# Patient Record
Sex: Female | Born: 1985 | Race: White | Hispanic: Yes | Marital: Married | Smoking: Never smoker
Health system: Southern US, Community
[De-identification: ages and names within clinical notes are randomized; demographics above are authoritative.]

## PROBLEM LIST (undated history)

## (undated) DIAGNOSIS — O009 Unspecified ectopic pregnancy without intrauterine pregnancy: Secondary | ICD-10-CM

## (undated) DIAGNOSIS — N915 Oligomenorrhea, unspecified: Secondary | ICD-10-CM

## (undated) DIAGNOSIS — Z789 Other specified health status: Secondary | ICD-10-CM

## (undated) HISTORY — DX: Oligomenorrhea, unspecified: N91.5

## (undated) HISTORY — DX: Other specified health status: Z78.9

## (undated) HISTORY — PX: NO PAST SURGERIES: SHX2092

## (undated) HISTORY — DX: Unspecified ectopic pregnancy without intrauterine pregnancy: O00.90

---

## 2009-09-15 ENCOUNTER — Ambulatory Visit: Payer: Self-pay | Admitting: Gynecology

## 2009-09-29 ENCOUNTER — Ambulatory Visit: Payer: Self-pay | Admitting: Gynecology

## 2009-10-13 ENCOUNTER — Ambulatory Visit: Payer: Self-pay | Admitting: Gynecology

## 2009-11-10 ENCOUNTER — Ambulatory Visit: Payer: Self-pay | Admitting: Gynecology

## 2009-11-17 ENCOUNTER — Ambulatory Visit: Payer: Self-pay | Admitting: Gynecology

## 2009-12-30 ENCOUNTER — Inpatient Hospital Stay (HOSPITAL_COMMUNITY): Admission: AD | Admit: 2009-12-30 | Discharge: 2009-12-30 | Payer: Self-pay | Admitting: Obstetrics & Gynecology

## 2009-12-30 ENCOUNTER — Ambulatory Visit: Payer: Self-pay | Admitting: Nurse Practitioner

## 2009-12-31 ENCOUNTER — Ambulatory Visit: Payer: Self-pay | Admitting: Gynecology

## 2010-01-01 ENCOUNTER — Inpatient Hospital Stay (HOSPITAL_COMMUNITY): Admission: AD | Admit: 2010-01-01 | Discharge: 2010-01-01 | Payer: Self-pay | Admitting: Gynecology

## 2010-01-01 ENCOUNTER — Ambulatory Visit: Payer: Self-pay | Admitting: Gynecology

## 2010-01-01 LAB — US OB TRANSVAGINAL

## 2010-01-05 ENCOUNTER — Ambulatory Visit: Payer: Self-pay | Admitting: Gynecology

## 2010-01-08 ENCOUNTER — Ambulatory Visit: Payer: Self-pay | Admitting: Women's Health

## 2010-01-14 ENCOUNTER — Ambulatory Visit: Payer: Self-pay | Admitting: Gynecology

## 2010-02-09 ENCOUNTER — Ambulatory Visit: Payer: Self-pay | Admitting: Gynecology

## 2010-05-04 ENCOUNTER — Ambulatory Visit: Payer: Self-pay | Admitting: Gynecology

## 2010-08-24 ENCOUNTER — Ambulatory Visit (INDEPENDENT_AMBULATORY_CARE_PROVIDER_SITE_OTHER): Payer: Self-pay | Admitting: Gynecology

## 2010-08-24 ENCOUNTER — Other Ambulatory Visit: Payer: Self-pay

## 2010-08-24 DIAGNOSIS — O9989 Other specified diseases and conditions complicating pregnancy, childbirth and the puerperium: Secondary | ICD-10-CM

## 2010-08-24 DIAGNOSIS — N912 Amenorrhea, unspecified: Secondary | ICD-10-CM

## 2010-08-24 LAB — US OB TRANSVAGINAL

## 2010-08-31 ENCOUNTER — Inpatient Hospital Stay (HOSPITAL_COMMUNITY)
Admission: AD | Admit: 2010-08-31 | Discharge: 2010-08-31 | Disposition: A | Discharge status: Home / Self Care | Payer: Self-pay | Source: Ambulatory Visit | Attending: Gynecology | Admitting: Gynecology

## 2010-08-31 ENCOUNTER — Ambulatory Visit (INDEPENDENT_AMBULATORY_CARE_PROVIDER_SITE_OTHER): Payer: Self-pay | Admitting: Gynecology

## 2010-08-31 ENCOUNTER — Other Ambulatory Visit: Payer: Self-pay

## 2010-08-31 ENCOUNTER — Other Ambulatory Visit (INDEPENDENT_AMBULATORY_CARE_PROVIDER_SITE_OTHER): Payer: Self-pay

## 2010-08-31 DIAGNOSIS — N912 Amenorrhea, unspecified: Secondary | ICD-10-CM

## 2010-08-31 DIAGNOSIS — O00109 Unspecified tubal pregnancy without intrauterine pregnancy: Secondary | ICD-10-CM

## 2010-08-31 LAB — US OB TRANSVAGINAL

## 2010-08-31 LAB — DIFFERENTIAL
Basophils Absolute: 0 10*3/uL (ref 0.0–0.1)
Basophils Relative: 0 % (ref 0–1)
Eosinophils Absolute: 0 10*3/uL (ref 0.0–0.7)
Lymphocytes Relative: 21 % (ref 12–46)
Lymphs Abs: 1.8 10*3/uL (ref 0.7–4.0)
Monocytes Absolute: 0.7 10*3/uL (ref 0.1–1.0)
Monocytes Relative: 9 % (ref 3–12)
Neutro Abs: 6 10*3/uL (ref 1.7–7.7)
Neutrophils Relative %: 70 % (ref 43–77)

## 2010-08-31 LAB — CBC
MCH: 30 pg (ref 26.0–34.0)
MCHC: 34.2 g/dL (ref 30.0–36.0)
MCV: 87.7 fL (ref 78.0–100.0)
Platelets: 214 10*3/uL (ref 150–400)
RBC: 4.3 MIL/uL (ref 3.87–5.11)
RDW: 13.4 % (ref 11.5–15.5)
WBC: 8.5 10*3/uL (ref 4.0–10.5)

## 2010-08-31 LAB — CREATININE, SERUM
Creatinine, Ser: 0.69 mg/dL (ref 0.4–1.2)
GFR calc non Af Amer: >60 mL/min (ref >60)

## 2010-08-31 LAB — AST: AST: 21 U/L (ref 0–37)

## 2010-09-03 ENCOUNTER — Other Ambulatory Visit (INDEPENDENT_AMBULATORY_CARE_PROVIDER_SITE_OTHER): Payer: Self-pay

## 2010-09-03 DIAGNOSIS — O00119 Unspecified tubal pregnancy with intrauterine pregnancy: Secondary | ICD-10-CM

## 2010-09-06 ENCOUNTER — Other Ambulatory Visit (INDEPENDENT_AMBULATORY_CARE_PROVIDER_SITE_OTHER): Payer: Self-pay

## 2010-09-06 DIAGNOSIS — N912 Amenorrhea, unspecified: Secondary | ICD-10-CM

## 2010-09-13 ENCOUNTER — Other Ambulatory Visit (INDEPENDENT_AMBULATORY_CARE_PROVIDER_SITE_OTHER): Payer: Self-pay

## 2010-09-13 DIAGNOSIS — O00109 Unspecified tubal pregnancy without intrauterine pregnancy: Secondary | ICD-10-CM

## 2010-09-20 ENCOUNTER — Other Ambulatory Visit (INDEPENDENT_AMBULATORY_CARE_PROVIDER_SITE_OTHER): Payer: Self-pay

## 2010-09-20 DIAGNOSIS — O00109 Unspecified tubal pregnancy without intrauterine pregnancy: Secondary | ICD-10-CM

## 2010-09-20 LAB — URINE MICROSCOPIC-ADD ON

## 2010-09-20 LAB — POCT PREGNANCY, URINE: Preg Test, Ur: POSITIVE

## 2010-09-20 LAB — URINALYSIS, ROUTINE W REFLEX MICROSCOPIC
Bilirubin Urine: NEGATIVE
Glucose, UA: NEGATIVE mg/dL
pH: 7 (ref 5.0–8.0)

## 2010-09-20 LAB — URINE CULTURE: Colony Count: 55000

## 2010-09-20 LAB — CBC
Hemoglobin: 12.5 g/dL (ref 12.0–15.0)
MCH: 31 pg (ref 26.0–34.0)

## 2010-09-20 LAB — HCG, QUANTITATIVE, PREGNANCY: hCG, Beta Chain, Quant, S: 326 m[IU]/mL — ABNORMAL HIGH (ref <5)

## 2010-09-20 LAB — WET PREP, GENITAL: Yeast Wet Prep HPF POC: NONE SEEN

## 2010-09-27 ENCOUNTER — Other Ambulatory Visit (INDEPENDENT_AMBULATORY_CARE_PROVIDER_SITE_OTHER): Payer: Self-pay

## 2010-09-27 DIAGNOSIS — O00109 Unspecified tubal pregnancy without intrauterine pregnancy: Secondary | ICD-10-CM

## 2010-09-30 ENCOUNTER — Other Ambulatory Visit: Payer: Self-pay

## 2010-10-04 ENCOUNTER — Other Ambulatory Visit (INDEPENDENT_AMBULATORY_CARE_PROVIDER_SITE_OTHER): Payer: Self-pay

## 2010-10-04 DIAGNOSIS — O00109 Unspecified tubal pregnancy without intrauterine pregnancy: Secondary | ICD-10-CM

## 2010-10-11 ENCOUNTER — Other Ambulatory Visit (INDEPENDENT_AMBULATORY_CARE_PROVIDER_SITE_OTHER): Payer: Self-pay

## 2010-10-11 DIAGNOSIS — O00109 Unspecified tubal pregnancy without intrauterine pregnancy: Secondary | ICD-10-CM

## 2010-10-12 ENCOUNTER — Other Ambulatory Visit: Payer: Self-pay

## 2010-10-14 ENCOUNTER — Other Ambulatory Visit: Payer: Self-pay

## 2010-10-29 ENCOUNTER — Other Ambulatory Visit: Payer: Self-pay

## 2012-03-28 ENCOUNTER — Ambulatory Visit (INDEPENDENT_AMBULATORY_CARE_PROVIDER_SITE_OTHER): Payer: Self-pay | Admitting: Gynecology

## 2012-03-28 ENCOUNTER — Encounter: Payer: Self-pay | Admitting: Gynecology

## 2012-03-28 VITALS — BP 116/70

## 2012-03-28 DIAGNOSIS — Z8742 Personal history of other diseases of the female genital tract: Secondary | ICD-10-CM

## 2012-03-28 DIAGNOSIS — N898 Other specified noninflammatory disorders of vagina: Secondary | ICD-10-CM

## 2012-03-28 DIAGNOSIS — Z8759 Personal history of other complications of pregnancy, childbirth and the puerperium: Secondary | ICD-10-CM

## 2012-03-28 DIAGNOSIS — N760 Acute vaginitis: Secondary | ICD-10-CM

## 2012-03-28 DIAGNOSIS — A499 Bacterial infection, unspecified: Secondary | ICD-10-CM

## 2012-03-28 LAB — WET PREP FOR TRICH, YEAST, CLUE: Yeast Wet Prep HPF POC: NONE SEEN

## 2012-03-28 MED ORDER — DOXYCYCLINE HYCLATE 100 MG PO CAPS: ORAL_CAPSULE | ORAL | Status: DC

## 2012-03-28 MED ORDER — METRONIDAZOLE 0.75 % VA GEL: 1.0000 | Freq: Two times a day (BID) | VAGINAL | Status: DC

## 2012-03-28 NOTE — Progress Notes (Signed)
Patient is a 26 year old gravida 2 para 0 AB 2 (right ectopic pregnancy treated with methotrexate 2011, left ectopic pregnancy treated with methotrexate 2012) presented to the office today complaining of vaginal discharge. She brought her Pap smear results that was [facility] which was normal and was found that she had yeast and was treated for moniliasis. She has informed me that she douches with vinegar and water daily. She also states that she does need a lot of dairy products as well as sweets. She has no family history of diabetes. Her menstrual cycles are regular. Husband using condoms for contraception.  Exam: Bartholin urethra Skene was within normal limits Vagina: No lesions or discharge Cervix: No lesions or discharge Bimanual exam: Not done Rectal exam: Not done  Wet prep few clue cells moderate WBC too numerous to count bacteria  Assessment/plan: Bacterial vaginosis will be treated with Cleocin vaginal cream each bedtime for 5 days. Patient was instructed to discontinue the daily douching with vinegar and water because her Zollie Beckers vaginal pH and predispose her to recurrent BV or yeast infections. I've given her information on refresh a probiotic gel that she continues after intercourse and after men. She would like to try to get pregnant once again. Review of her record indicated that she was treated with methotrexate for ectopic pregnancies on right and left fallopian tube 2 years apart. I explained her the before she attempts to conceive that I would like for her to have an HSG to look at the status of her fallopian tubes. She will call the office at the start of her next menstrual cycle. I've called in a prescription for Vibramycin to take 100 mg tablet twice a day for 3 days starting with the day before the procedure. I would then like to see her in consultation the week after the procedure to plan a course of management. I've instructed to go ahead and start  taking her prenatal vitamins daily. All the above instructions were provided in Spanish and we'll follow accordingly.

## 2012-03-28 NOTE — Patient Instructions (Addendum)
Bacterial Vaginosis Bacterial vaginosis (BV) is a vaginal infection where the normal balance of bacteria in the vagina is disrupted. The normal balance is then replaced by an overgrowth of certain bacteria. There are several different kinds of bacteria that can cause BV. BV is the most common vaginal infection in women of childbearing age. CAUSES   The cause of BV is not fully understood. BV develops when there is an increase or imbalance of harmful bacteria.   Some activities or behaviors can upset the normal balance of bacteria in the vagina and put women at increased risk including:   Having a new sex partner or multiple sex partners.   Douching.   Using an intrauterine device (IUD) for contraception.   It is not clear what role sexual activity plays in the development of BV. However, women that have never had sexual intercourse are rarely infected with BV.  Women do not get BV from toilet seats, bedding, swimming pools or from touching objects around them.  SYMPTOMS   Grey vaginal discharge.   A fish-like odor with discharge, especially after sexual intercourse.   Itching or burning of the vagina and vulva.   Burning or pain with urination.   Some women have no signs or symptoms at all.  DIAGNOSIS  Your caregiver must examine the vagina for signs of BV. Your caregiver will perform lab tests and look at the sample of vaginal fluid through a microscope. They will look for bacteria and abnormal cells (clue cells), a pH test higher than 4.5, and a positive amine test all associated with BV.  RISKS AND COMPLICATIONS   Pelvic inflammatory disease (PID).   Infections following gynecology surgery.   Developing HIV.   Developing herpes virus.  TREATMENT  Sometimes BV will clear up without treatment. However, all women with symptoms of BV should be treated to avoid complications, especially if gynecology surgery is planned. Female partners generally do not need to be treated. However,  BV may spread between female sex partners so treatment is helpful in preventing a recurrence of BV.   BV may be treated with antibiotics. The antibiotics come in either pill or vaginal cream forms. Either can be used with nonpregnant or pregnant women, but the recommended dosages differ. These antibiotics are not harmful to the baby.   BV can recur after treatment. If this happens, a second round of antibiotics will often be prescribed.   Treatment is important for pregnant women. If not treated, BV can cause a premature delivery, especially for a pregnant woman who had a premature birth in the past. All pregnant women who have symptoms of BV should be checked and treated.   For chronic reoccurrence of BV, treatment with a type of prescribed gel vaginally twice a week is helpful.  HOME CARE INSTRUCTIONS   Finish all medication as directed by your caregiver.   Do not have sex until treatment is completed.   Tell your sexual partner that you have a vaginal infection. They should see their caregiver and be treated if they have problems, such as a mild rash or itching.   Practice safe sex. Use condoms. Only have 1 sex partner.  PREVENTION  Basic prevention steps can help reduce the risk of upsetting the natural balance of bacteria in the vagina and developing BV:  Do not have sexual intercourse (be abstinent).   Do not douche.   Use all of the medicine prescribed for treatment of BV, even if the signs and symptoms go away.     Tell your sex partner if you have BV. That way, they can be treated, if needed, to prevent reoccurrence.  SEEK MEDICAL CARE IF:   Your symptoms are not improving after 3 days of treatment.   You have increased discharge, pain, or fever.  MAKE SURE YOU:   Understand these instructions.   Will watch your condition.   Will get help right away if you are not doing well or get worse.  FOR MORE INFORMATION  Division of STD Prevention (DSTDP), Centers for Disease  Control and Prevention: SolutionApps.co.za American Social Health Association (ASHA): www.ashastd.org  Document Released: 06/20/2005 Document Revised: 06/09/2011 Document Reviewed: 12/11/2008 [facility] Patient Information 2012 [place], [place].  Histerosalpingografa (Hysterosalpingography)  La histerosalpingografa es una radiografa del tero y de las trompas de [place]. Se realiza para diagnosticar:  Problemas para quedar embarazada.   Prdidas continuas del embarazo(aborto espontneo).  ANTES DE LA PRUEBA  Programe el estudio entre el 5 y el 10 da del ltimo perodo. El da 1 es Film/video editor del perodo.   Consulte a su mdico si debe modificar o suspender los medicamentos.   Informe al mdico si est embarazada o si sufre una infeccin o una enfermedad de transmisin sexual.   Informe si sufre alergias al yodo o a las sustancias de [place] utilizadas para Optometrist.   Coma y beba normalmente.  PRUEBA  Deber recostarse en una camilla con los pies en la piernera.   Le colocarn un tubo en el canal cervical.   Inyectarn una sustancia de contraste dentro del tero a travs del tubo.   A medida que toman las radiografas, la harn [employer].   Despus de la prueba retirarn el tubo.  DESPUES DEL ESTUDIO  Eliminar la mayor parte del lquido de [place] natural.   Podr sentir clicos y Winferd Humphrey pequea prdida. Las [employer] deben desaparecer dentro de las 24 horas.   Tome slo la medicacin que le indic el mdico.   Consulte con su mdico la fecha en que los resultados estarn disponibles. Asegrese de [employer].  Document Released: 10/05/2010 Document Revised: 06/09/2011 [facility] Patient Information 2012 [place], [place].   LLamar a Product manager oficina de Dr. Earlean Shawl) cuando empieze su periodo para que le haga la cita en el hospital [facility] para el estudio HSG. Tambien dirle que le haga una cita con Dr. [place] despues del estudio para consulta. Empieze el antibiotico el dia antes del procedimiento (dos veces al dia). Puede empezar hoy a tomar la vitamina prenatal una diaria.

## 2012-04-13 ENCOUNTER — Other Ambulatory Visit: Payer: Self-pay | Admitting: Gynecology

## 2012-04-13 ENCOUNTER — Other Ambulatory Visit: Payer: Self-pay | Admitting: *Deleted

## 2012-04-13 ENCOUNTER — Telehealth: Payer: Self-pay | Admitting: Gynecology

## 2012-04-13 DIAGNOSIS — N979 Female infertility, unspecified: Secondary | ICD-10-CM

## 2012-04-13 DIAGNOSIS — N971 Female infertility of tubal origin: Secondary | ICD-10-CM

## 2012-04-13 NOTE — Telephone Encounter (Signed)
PATIENT CALL NEEDING HSG SCHEDULED, HER LMP IS 04-11-12. I SPOKE WITH PAULA @ [facility], HSG SCHEDULED FOR 04-18-12 @ 8:30 AM PT TO CHK IN @ 8:00 AND TO PAY $742.50 UPFRONT. JENNIFER TO SEND ORDER TO [facility]. PATIENT TO CALL ME BACK IF SHE NEEDS RX. SPOKE WITH HUSBAND  AND GAVE HIM ALL THE INFROMATION. ALSO F/U APPT WITH DR Lily Peer WAS MADE FOR 04-25-12. CS

## 2012-04-18 ENCOUNTER — Ambulatory Visit (HOSPITAL_COMMUNITY)
Admission: RE | Admit: 2012-04-18 | Discharge: 2012-04-18 | Disposition: A | Discharge status: Home / Self Care | Payer: Self-pay | Source: Ambulatory Visit | Attending: Gynecology | Admitting: Gynecology

## 2012-04-18 ENCOUNTER — Ambulatory Visit: Payer: Self-pay | Admitting: Gynecology

## 2012-04-18 DIAGNOSIS — N979 Female infertility, unspecified: Secondary | ICD-10-CM | POA: Insufficient documentation

## 2012-04-18 DIAGNOSIS — N971 Female infertility of tubal origin: Secondary | ICD-10-CM

## 2012-04-18 MED ORDER — IOHEXOL 300 MG/ML  SOLN: 9.0000 mL | Freq: Once | INTRAMUSCULAR | Status: AC | PRN

## 2012-04-25 ENCOUNTER — Encounter: Payer: Self-pay | Admitting: Gynecology

## 2012-04-25 ENCOUNTER — Ambulatory Visit (INDEPENDENT_AMBULATORY_CARE_PROVIDER_SITE_OTHER): Payer: Self-pay | Admitting: Gynecology

## 2012-04-25 VITALS — BP 110/72

## 2012-04-25 DIAGNOSIS — Z8759 Personal history of other complications of pregnancy, childbirth and the puerperium: Secondary | ICD-10-CM

## 2012-04-25 DIAGNOSIS — Z8742 Personal history of other diseases of the female genital tract: Secondary | ICD-10-CM

## 2012-04-25 MED ORDER — CLOMIPHENE CITRATE 50 MG PO TABS: ORAL_TABLET | ORAL | Status: DC

## 2012-04-25 NOTE — Patient Instructions (Addendum)
Plan: Eesperar que le comienze el periodo. El primer dia del periodo es el primer dia de tu ciclo. Cuentas los dia. Del dia 5-9 del ciclo empieze a tomar dos tabletas de clomid diario.  Del dia 12-16 chequiar la orina con el detector de ovulaccion por la manana y por la noche. Cuando notes el cambio tenga relaccion con su pareja esa noche y proximas dos noches  Dia 20 o 21 0 22 venir a la oficina a sacar muestra de sangre (nivel de progesterona). No tiene que hacer cita ni tienemque estar en ayuna. Cuando vengas a sacar sangre le dices a la recepcionista que necesitas una cita con Dr. Renny Gunnarson en una semana despues de sacar sangre. 

## 2012-04-25 NOTE — Progress Notes (Signed)
Patient is a 26 year old who was seen in the office on September 25 for her annual exam. She is a gravida 2 para 0 AB 2 (right ectopic pregnancy treated with methotrexate 2011, left ectopic pregnancy treated with methotrexate 2012) she stated she had a normal Pap smear at the [facility] department January this year. Patient states that her cycles are every 25-32 days. She is taking her prenatal vitamins and now would like to attempt to get pregnant again. We had scheduled a hysterosalpingogram to make sure that she had tubal patency. An HSG report as follows:  Contrast filling of both fallopian tubes is seen, and both tubes  are normal in appearance. Intraperitoneal spill of contrast from  both fallopian tubes is demonstrated.  IMPRESSION:  Normal study. Fallopian tubes are patent bilaterally.  Assessment/plan: Patient with past history of ectopic pregnancy on each fallopian tube treated medically with methotrexate. Patient wishing to attempt to get pregnant again. Recent HSG confirming bilateral tubal patency. Patient with irregular cycles although has not fully skipped a full month. We had discussed the ovulation induction medication the risks benefits and pros and con to include the risk of hyperstimulation or multiple gestation.. She'll be started on clomiphene citrate 100 mg one by mouth daily from day 5 through 9. She will use the ovulation predictor kit from day 12 through 16 to time or intercourse. She will return back to the office on day 20 or 21 or 20 240 progesterone level and then to see me in consultation one week after her blood was drawn. She will continue her prenatal vitamins. All the above instructions were provided in Spanish.

## 2012-04-25 NOTE — Progress Notes (Deleted)
Plan: Eesperar que le comienze el periodo. El primer dia del periodo es el primer dia de tu ciclo. Cuentas los dia. Del dia 5-9 del ciclo empieze a tomar dos tabletas de clomid diario.  Del dia 12-16 chequiar la orina con Armed forces operational officer de ovulaccion por la manana y por la noche. Cuando notes el cambio tenga relaccion con su pareja esa noche y proximas dos noches  Dia 20 o 21 0 22 venir a la oficina a Environmental consultant de sangre (nivel de progesterona). No tiene que hacer cita ni tienemque estar en ayuna. Cuando vengas a Education officer, museum a la recepcionista que necesitas una cita con Dr. Lily Peer en una semana despues de sacar sangre.

## 2012-06-15 ENCOUNTER — Telehealth: Payer: Self-pay | Admitting: Anesthesiology

## 2012-06-15 MED ORDER — MEDROXYPROGESTERONE ACETATE 10 MG PO TABS: 10.0000 mg | ORAL_TABLET | Freq: Every day | ORAL | Status: DC

## 2012-06-15 NOTE — Telephone Encounter (Signed)
Dr. Lily Peer patient called requesting a rx for provera, she has not had a period in two months... On her last visit in October you gave her a rx for clomid but has not been able to start on it because of her amenorrhea...Marland KitchenMarland Kitchen  Please advise

## 2012-06-15 NOTE — Telephone Encounter (Signed)
CALLED PATIENT AND INFORMED OF MESSAGE BELOW.. ALL WAS EXPLAINED IN SPANISH.Marland Kitchen PROVERA WAS SENT TO PHARMACY... ADVISE TO CALL BACK WITH ANY ADDITIONAL QUESTIONS.Marland KitchenMarland Kitchen

## 2012-06-15 NOTE — Telephone Encounter (Signed)
Please inform patient to do a home pregnancy test. If her home pregnancy test is negative please call in a prescription for Provera 10 mg to take 1 tablet daily for 5-10 days to start her menses. When she starts her menses she can stop the Provera. She can start the Clomid from day 5 through 9 of her cycle and follow the instructions that I described on my last encounter note. If her pregnancy test is negative and if she takes the Provera for 10 days and one week after her last tablet she still has not had a menses she will need to return to the office for further evaluation.

## 2012-12-12 ENCOUNTER — Ambulatory Visit (INDEPENDENT_AMBULATORY_CARE_PROVIDER_SITE_OTHER): Payer: Self-pay | Admitting: Gynecology

## 2012-12-12 ENCOUNTER — Encounter: Payer: Self-pay | Admitting: Gynecology

## 2012-12-12 VITALS — BP 110/70 | Ht 62.25 in | Wt 124.0 lb

## 2012-12-12 DIAGNOSIS — N915 Oligomenorrhea, unspecified: Secondary | ICD-10-CM | POA: Insufficient documentation

## 2012-12-12 DIAGNOSIS — N898 Other specified noninflammatory disorders of vagina: Secondary | ICD-10-CM

## 2012-12-12 DIAGNOSIS — R300 Dysuria: Secondary | ICD-10-CM

## 2012-12-12 DIAGNOSIS — Z01419 Encounter for gynecological examination (general) (routine) without abnormal findings: Secondary | ICD-10-CM

## 2012-12-12 LAB — CBC WITH DIFFERENTIAL/PLATELET
Basophils Absolute: 0.1 10*3/uL (ref 0.0–0.1)
Basophils Relative: 1 % (ref 0–1)
Eosinophils Absolute: 0.1 10*3/uL (ref 0.0–0.7)
Eosinophils Relative: 1 % (ref 0–5)
HCT: 40.3 % (ref 36.0–46.0)
MCH: 29.2 pg (ref 26.0–34.0)
MCHC: 33 g/dL (ref 30.0–36.0)
MCV: 88.6 fL (ref 78.0–100.0)
Monocytes Absolute: 0.4 10*3/uL (ref 0.1–1.0)
RDW: 13.8 % (ref 11.5–15.5)

## 2012-12-12 LAB — URINALYSIS W MICROSCOPIC + REFLEX CULTURE
Bilirubin Urine: NEGATIVE
Specific Gravity, Urine: 1.02 (ref 1.005–1.030)
pH: 6 (ref 5.0–8.0)

## 2012-12-12 LAB — WET PREP FOR TRICH, YEAST, CLUE
Trich, Wet Prep: NONE SEEN
WBC, Wet Prep HPF POC: NONE SEEN

## 2012-12-12 MED ORDER — MEDROXYPROGESTERONE ACETATE 10 MG PO TABS: 10.0000 mg | ORAL_TABLET | Freq: Every day | ORAL | Status: DC

## 2012-12-12 MED ORDER — CLOMIPHENE CITRATE 50 MG PO TABS: ORAL_TABLET | ORAL | Status: DC

## 2012-12-12 NOTE — Progress Notes (Signed)
Ashley Holden December 21, 1985 [id number]   History:    27 y.o.  for annual gyn exam with several complaints today for. The patient stated she was having some burning in urination and slight vaginal discharge. She does have history of oligomenorrhea. Patient has been treated in the past on 2 separate occasions for right and left ectopic pregnancies respectively treated with methotrexate. Recently she was on clomiphene citrate 100 mg a day 5 through 9 and signed or intercourse ovulation predictor kit and has not conceived. She states her last menstrual period was May 19. She denies any visual disturbances, headaches or nipple discharge. She does her mother breast exam.  Past medical history,surgical history, family history and social history were all reviewed and documented in the EPIC chart.  Gynecologic History Patient's last menstrual period was 11/19/2012. Contraception: none Last Pap: 2014. Results were: normal Last mammogram: not indicated. Results were: none indicated  Obstetric History OB History   Grav Para Term Preterm Abortions TAB SAB Ect Mult Living   2 0   2   2  0     # Outc Date GA Lbr Len/2nd Wgt Sex Del Anes PTL Lv   1 ECT            2 ECT                ROS: A ROS was performed and pertinent positives and negatives are included in the history.  GENERAL: No fevers or chills. HEENT: No change in vision, no earache, sore throat or sinus congestion. NECK: No pain or stiffness. CARDIOVASCULAR: No chest pain or pressure. No palpitations. PULMONARY: No shortness of breath, cough or wheeze. GASTROINTESTINAL: No abdominal pain, nausea, vomiting or diarrhea, melena or bright red blood per rectum. GENITOURINARY: No urinary frequency, urgency, hesitancy or dysuria. MUSCULOSKELETAL: No joint or muscle pain, no back pain, no recent trauma. DERMATOLOGIC: No rash, no itching, no lesions. ENDOCRINE: No polyuria, polydipsia, no heat or cold intolerance. No recent change in weight. HEMATOLOGICAL:  No anemia or easy bruising or bleeding. NEUROLOGIC: No headache, seizures, numbness, tingling or weakness. PSYCHIATRIC: No depression, no loss of interest in normal activity or change in sleep pattern.     Exam: chaperone present  BP 110/70  Ht 5' 2.25" (1.581 m)  Wt 124 lb (56.246 kg)  BMI 22.5 kg/m2  LMP 11/19/2012  Body mass index is 22.5 kg/(m^2).  General appearance : Well developed well nourished female. No acute distress HEENT: Neck supple, trachea midline, no carotid bruits, no thyroidmegaly Lungs: Clear to auscultation, no rhonchi or wheezes, or rib retractions  Heart: Regular rate and rhythm, no murmurs or gallops Breast:Examined in sitting and supine position were symmetrical in appearance, no palpable masses or tenderness,  no skin retraction, no nipple inversion, no nipple discharge, no skin discoloration, no axillary or supraclavicular lymphadenopathy Abdomen: no palpable masses or tenderness, no rebound or guarding Extremities: no edema or skin discoloration or tenderness  Pelvic:  Bartholin, Urethra, Skene Glands: Within normal limits             Vagina: No gross lesions or discharge  Cervix: No gross lesions or discharge  Uterus  anteverted, normal size, shape and consistency, non-tender and mobile  Adnexa  Without masses or tenderness  Anus and perineum  normal   Rectovaginal  normal sphincter tone without palpated masses or tenderness             Hemoccult none indicated   Urinalysis few bacteria, 0-3 RBC, 3-6 WBC.  Culture submitted  Wet prep: negative   Assessment/Plan:  27 y.o. female for annual exam With history of oligomenorrhea. She will wait to the end of this month to see if she conceives on her own or does not. She will do a home pregnancy test at the end of the month if  she has not menstruated. If her pregnancy test is negative she will take Provera 10 mg for 5-10 days to initiate her cycle. She will restart again a clomiphene citrate 100 mg daily 5  through 9 and will use an ovulation predictor kit to time her intercourse. We'll do this for 3 more cycles if she is not successful in conceiving she'll be referred to the reproductive endocrinologist for consideration of in vitro fertilization. Of note the patient had a normal HSG in 2013. Her husband has had a normal semen analysis. We will check today a TSH, prolactin, screening cholesterol, urinalysis, CBC and fasting blood sugar.she was reminded once again but when she conceived she needs to have an early ultrasound because she is a high risk for an ectopic pregnancy since she has had 2 in the past already.    Ok Edwards MD, 11:19 AM 12/12/2012

## 2012-12-19 ENCOUNTER — Ambulatory Visit: Payer: No Typology Code available for payment source | Attending: Family Medicine | Admitting: Internal Medicine

## 2012-12-19 VITALS — BP 106/75 | HR 72 | Temp 98.3°F | Resp 16 | Ht 61.0 in | Wt 124.2 lb

## 2012-12-19 DIAGNOSIS — N760 Acute vaginitis: Secondary | ICD-10-CM

## 2012-12-19 LAB — CBC WITH DIFFERENTIAL/PLATELET
Eosinophils Relative: 1 % (ref 0–5)
HCT: 41.2 % (ref 36.0–46.0)
Lymphocytes Relative: 33 % (ref 12–46)
Lymphs Abs: 2.1 10*3/uL (ref 0.7–4.0)
MCV: 85.7 fL (ref 78.0–100.0)
Monocytes Absolute: 0.5 10*3/uL (ref 0.1–1.0)
Neutro Abs: 3.7 10*3/uL (ref 1.7–7.7)
RBC: 4.81 MIL/uL (ref 3.87–5.11)
WBC: 6.4 10*3/uL (ref 4.0–10.5)

## 2012-12-19 LAB — BASIC METABOLIC PANEL
BUN: 13 mg/dL (ref 6–23)
CO2: 25 mEq/L (ref 19–32)
Chloride: 105 mEq/L (ref 96–112)
Creat: 0.8 mg/dL (ref 0.50–1.10)
Potassium: 4.3 mEq/L (ref 3.5–5.3)

## 2012-12-19 MED ORDER — CIPROFLOXACIN HCL 500 MG PO TABS: 500.0000 mg | ORAL_TABLET | Freq: Two times a day (BID) | ORAL | Status: DC

## 2012-12-19 MED ORDER — PHENAZOPYRIDINE HCL 200 MG PO TABS: 200.0000 mg | ORAL_TABLET | Freq: Three times a day (TID) | ORAL | Status: DC | PRN

## 2012-12-19 MED ORDER — HYDROCORTISONE ACETATE 25 MG RE SUPP: 25.0000 mg | Freq: Two times a day (BID) | RECTAL | Status: DC

## 2012-12-19 MED ORDER — SULFAMETHOXAZOLE-TRIMETHOPRIM 800-160 MG PO TABS: 1.0000 | ORAL_TABLET | Freq: Two times a day (BID) | ORAL | Status: AC

## 2012-12-19 NOTE — Progress Notes (Unsigned)
Patient here to establish care Complains of burning when voiding Pain when she has a bowel movement Some rectal itching Pain in her left shoulder

## 2012-12-19 NOTE — Progress Notes (Unsigned)
Patient ID: Ashley Holden, female   DOB: [birth year], 27 y.o.   MRN: [id number]  CC:  HPI: 27 year old female who presents with the chief complaint of urinary urgency frequency dysuria. The patient has had the symptoms for a couple of months. The patient also complains of rectal itching and rectal pain after having a BM occasionally she sees sees a little bit of blood in the stool. She's not sexually active at this time.    No Known Allergies Past Medical History  Diagnosis Date  . Oligomenorrhea   . Ectopic pregnancy     RIGHT ON 08/13/2009- METHOTREXATE AND 08/31/10 ON LEFT - METHOTREXATE   Current Outpatient Prescriptions on File Prior to Visit  Medication Sig Dispense Refill  . clomiPHENE (CLOMID) 50 MG tablet Take 2 tablets daily for 5 days from day 5 through 9 of cycle  10 tablet  2  . doxycycline (VIBRAMYCIN) 100 MG capsule Take 1 tablet twice a day for 3 days starting day before procedure  6 capsule  0  . medroxyPROGESTERone (PROVERA) 10 MG tablet Take 1 tablet (10 mg total) by mouth daily.  10 tablet  0  . metroNIDAZOLE (METROGEL) 0.75 % vaginal gel Place 1 Applicatorful vaginally 2 (two) times daily.  70 g  0  . PRENATAL VITAMINS PO Take by mouth.       No current facility-administered medications on file prior to visit.   History reviewed. No pertinent family history. History   Social History  . Marital Status: Single    Spouse Name: N/A    Number of Children: N/A  . Years of Education: N/A   Occupational History  . Not on file.   Social History Main Topics  . Smoking status: Never Smoker   . Smokeless tobacco: Never Used  . Alcohol Use: No  . Drug Use: No  . Sexually Active: Yes    Birth Control/ Protection: Coitus interruptus   Other Topics Concern  . Not on file   Social History Narrative  . No narrative on file    Review of Systems  Constitutional: Negative for fever, chills, diaphoresis, activity change, appetite change and fatigue.  HENT: Negative  for ear pain, nosebleeds, congestion, facial swelling, rhinorrhea, neck pain, neck stiffness and ear discharge.   Eyes: Negative for pain, discharge, redness, itching and visual disturbance.  Respiratory: Negative for cough, choking, chest tightness, shortness of breath, wheezing and stridor.   Cardiovascular: Negative for chest pain, palpitations and leg swelling.  Gastrointestinal: Negative for abdominal distention.  Genitourinary: Negative for dysuria, urgency, frequency, hematuria, flank pain, decreased urine volume, difficulty urinating and dyspareunia.  Musculoskeletal: Negative for back pain, joint swelling, arthralgias and gait problem.  Neurological: Negative for dizziness, tremors, seizures, syncope, facial asymmetry, speech difficulty, weakness, light-headedness, numbness and headaches.  Hematological: Negative for adenopathy. Does not bruise/bleed easily.  Psychiatric/Behavioral: Negative for hallucinations, behavioral problems, confusion, dysphoric mood, decreased concentration and agitation.    Objective:   Filed Vitals:   12/19/12 1233  BP: 106/75  Pulse: 72  Temp: 98.3 F (36.8 C)  Resp: 16    Physical Exam  Constitutional: Appears well-developed and well-nourished. No distress.  HENT: Normocephalic. External right and left ear normal. Oropharynx is clear and moist.  Eyes: Conjunctivae and EOM are normal. PERRLA, no scleral icterus.  Neck: Normal ROM. Neck supple. No JVD. No tracheal deviation. No thyromegaly.  CVS: RRR, S1/S2 +, no murmurs, no gallops, no carotid bruit.  Pulmonary: Effort and breath sounds normal, no stridor, rhonchi,  wheezes, rales.  Abdominal: Soft. BS +,  no distension, tenderness, rebound or guarding.  Musculoskeletal: Normal range of motion. No edema and no tenderness.  Lymphadenopathy: No lymphadenopathy noted, cervical, inguinal. Neuro: Alert. Normal reflexes, muscle tone coordination. No cranial nerve deficit. Skin: Skin is warm and dry. No  rash noted. Not diaphoretic. No erythema. No pallor.  Psychiatric: Normal mood and affect. Behavior, judgment, thought content normal.   Lab Results  Component Value Date   WBC 5.5 12/12/2012   HGB 13.3 12/12/2012   HCT 40.3 12/12/2012   MCV 88.6 12/12/2012   PLT 246 12/12/2012   Lab Results  Component Value Date   CREATININE 0.69 08/31/2010   BUN 8 08/31/2010    No results found for this basename: HGBA1C   Lipid Panel  No results found for this basename: chol, trig, hdl, cholhdl, vldl, ldlcalc       Assessment and plan:   Patient Active Problem List   Diagnosis Date Noted  . Oligomenorrhea 12/12/2012  . History of ectopic pregnancy 03/28/2012   Vaginitis Obtain a urine analysis Urine for chlamydia, gonorrhea, Trichomonas Patient is also referred to gynecology for a Pap smear She is provided with prescription for ciprofloxacin for 7 days [facility] for hemorrhoids Followup with [place] when necessary otherwise routine followup in 4 months for health maintenance

## 2012-12-20 LAB — URINALYSIS W MICROSCOPIC + REFLEX CULTURE

## 2012-12-21 ENCOUNTER — Ambulatory Visit: Payer: No Typology Code available for payment source | Attending: Family Medicine

## 2012-12-21 DIAGNOSIS — R300 Dysuria: Secondary | ICD-10-CM

## 2012-12-25 ENCOUNTER — Telehealth: Payer: Self-pay

## 2012-12-25 NOTE — Progress Notes (Signed)
Quick Note:  Please inform patient that ancillary urine studies negative for Bacterial vaginosis and yeast.   Rodney Langton, MD, CDE, FAAFP [facility] [place], [place]   ______

## 2012-12-25 NOTE — Telephone Encounter (Signed)
Left message to return our call.

## 2013-01-23 ENCOUNTER — Ambulatory Visit: Payer: Self-pay | Admitting: Gynecology

## 2013-01-30 ENCOUNTER — Ambulatory Visit: Payer: Self-pay | Admitting: Gynecology

## 2013-02-06 ENCOUNTER — Ambulatory Visit: Payer: No Typology Code available for payment source | Attending: Family Medicine | Admitting: Internal Medicine

## 2013-02-06 ENCOUNTER — Encounter: Payer: Self-pay | Admitting: Internal Medicine

## 2013-02-06 VITALS — BP 105/72 | HR 67 | Temp 97.8°F | Resp 18 | Ht 63.0 in | Wt 124.0 lb

## 2013-02-06 DIAGNOSIS — N898 Other specified noninflammatory disorders of vagina: Secondary | ICD-10-CM

## 2013-02-06 MED ORDER — PROMETHAZINE HCL 12.5 MG PO TABS: 12.5000 mg | ORAL_TABLET | Freq: Three times a day (TID) | ORAL | Status: DC | PRN

## 2013-02-06 NOTE — Progress Notes (Signed)
Patient ID: Ashley Holden, female   DOB: 03/13/1986, 27 y.o.   MRN: [id number] Patient Demographics  Ashley Holden, is a 27 y.o. female  IRJ:[id number]  [id number]  DOB - [birth year]  No chief complaint on file.       Subjective:   Ashley Holden today is here for a follow up visit.  Reports vaginal discharge. Had gone to [facility] urgent care, had vaginal exam, was given prescription for Flagyl x 7days, started today. She does not want to go back there and wants referral to [facility]. C/o nausea with flagyl. LMP 01/28/13, not sexually active per patient.   Patient has No headache, No chest pain, No abdominal pain , No new weakness tingling or numbness, No Cough - SOB.   Objective:    There were no vitals filed for this visit.   ALLERGIES:  No Known Allergies  PAST MEDICAL HISTORY: Past Medical History  Diagnosis Date  . Oligomenorrhea   . Ectopic pregnancy     RIGHT ON 08/13/2009- METHOTREXATE AND 08/31/10 ON LEFT - METHOTREXATE    MEDICATIONS AT HOME: Prior to Admission medications   Medication Sig Start Date End Date Taking? Authorizing Provider  clomiPHENE (CLOMID) 50 MG tablet Take 2 tablets daily for 5 days from day 5 through 9 of cycle 12/12/12   Ok Edwards, MD  hydrocortisone (ANUSOL-HC) 25 MG suppository Place 1 suppository (25 mg total) rectally 2 (two) times daily. 12/19/12   Richarda Overlie, MD  medroxyPROGESTERone (PROVERA) 10 MG tablet Take 1 tablet (10 mg total) by mouth daily. 12/12/12   Ok Edwards, MD  metroNIDAZOLE (METROGEL) 0.75 % vaginal gel Place 1 Applicatorful vaginally 2 (two) times daily. 03/28/12   Ok Edwards, MD  phenazopyridine (PYRIDIUM) 200 MG tablet Take 1 tablet (200 mg total) by mouth 3 (three) times daily as needed for pain. 12/19/12   Richarda Overlie, MD  PRENATAL VITAMINS PO Take by mouth.    Historical Provider, MD  promethazine (PHENERGAN) 12.5 MG tablet Take 1 tablet (12.5 mg total) by mouth every 8 (eight) hours as  needed for nausea. 02/06/13   Ripudeep Jenna Luo, MD     Exam  General appearance :Awake, alert, NAD, Speech Clear.  HEENT: Atraumatic and Normocephalic, PERLA Neck: supple, no JVD. No cervical lymphadenopathy.  Chest: Clear to auscultation bilaterally, no wheezing, rales or rhonchi CVS: S1 S2 regular, no murmurs.  Abdomen: soft, NBS, NT, ND, no gaurding, rigidity or rebound. Extremities: no cyanosis or clubbing, B/L Lower Ext shows no edema Neurology: Awake alert, and oriented X 3, CN II-XII intact, Non focal Skin: No Rash or lesions Wounds:N/A    Data Review   Basic Metabolic Panel: No results found for this basename: NA, K, CL, CO2, GLUCOSE, BUN, CREATININE, CALCIUM, MG, PHOS,  in the last 168 hours Liver Function Tests: No results found for this basename: AST, ALT, ALKPHOS, BILITOT, PROT, ALBUMIN,  in the last 168 hours  CBC: No results found for this basename: WBC, NEUTROABS, HGB, HCT, MCV, PLT,  in the last 168 hours  ------------------------------------------------------------------------------------------------------------------ No results found for this basename: HGBA1C,  in the last 72 hours ------------------------------------------------------------------------------------------------------------------ No results found for this basename: CHOL, HDL, LDLCALC, TRIG, CHOLHDL, LDLDIRECT,  in the last 72 hours ------------------------------------------------------------------------------------------------------------------ No results found for this basename: TSH, T4TOTAL, FREET3, T3FREE, THYROIDAB,  in the last 72 hours ------------------------------------------------------------------------------------------------------------------ No results found for this basename: VITAMINB12, FOLATE, FERRITIN, TIBC, IRON, RETICCTPCT,  in the last 72 hours  Coagulation profile  No  results found for this basename: INR, PROTIME,  in the last 168 hours    Assessment & Plan   Active  Problems: Vaginal discharge: Patient just started Flagyl today for 7 days, recommended to continue it, take with food. - gave prescription for phenergan 12.5mg  q 8hrs PRN (#20) - ambulatory referral to ob-gyn/ women's health clinic sent - last PAP in April, 14, per patient was normal  Recommendations:  ambulatory referral to ob-gyn/ women's health clinic Follow-up in 3months as needed     RAI,RIPUDEEP M.D. 02/06/2013, 11:21 AM

## 2013-02-06 NOTE — Progress Notes (Signed)
02/06/13 Present to clinic vaginal discharge and referral to [facility]

## 2013-03-20 ENCOUNTER — Ambulatory Visit (INDEPENDENT_AMBULATORY_CARE_PROVIDER_SITE_OTHER): Payer: Self-pay | Admitting: Women's Health

## 2013-03-20 ENCOUNTER — Encounter: Payer: Self-pay | Admitting: Women's Health

## 2013-03-20 DIAGNOSIS — N898 Other specified noninflammatory disorders of vagina: Secondary | ICD-10-CM

## 2013-03-20 DIAGNOSIS — R300 Dysuria: Secondary | ICD-10-CM

## 2013-03-20 DIAGNOSIS — N390 Urinary tract infection, site not specified: Secondary | ICD-10-CM

## 2013-03-20 DIAGNOSIS — L293 Anogenital pruritus, unspecified: Secondary | ICD-10-CM

## 2013-03-20 LAB — WET PREP FOR TRICH, YEAST, CLUE: Yeast Wet Prep HPF POC: NONE SEEN

## 2013-03-20 LAB — URINALYSIS W MICROSCOPIC + REFLEX CULTURE
Casts: NONE SEEN
Glucose, UA: NEGATIVE mg/dL
pH: 7 (ref 5.0–8.0)

## 2013-03-20 MED ORDER — SULFAMETHOXAZOLE-TRIMETHOPRIM 800-160 MG PO TABS: 1.0000 | ORAL_TABLET | Freq: Two times a day (BID) | ORAL | Status: DC

## 2013-03-20 NOTE — Progress Notes (Signed)
Patient ID: Ashley Holden, female   DOB: Mar 01, 1986, 27 y.o.   MRN: [id number] Presents  with complaint of painful urination with pain at end of stream for 3 days, and white vaginal discharge. Denies fever, nausea, itching, or urinary frequency.  Same partner. Husband interpreted/ Spanish-speaking.  Exam: Speculum exam, mild erythema with some white discharge. Wet prep negative. Bimanual exam, no CMT, no adnexal tenderness of fullness. No pain with exam.  UA: Squamous Epis: many, WBC:3-6, RBC: 0-2, Bacteria: few  UTI per symptoms  Plan: Bactrim DS 800-160mg  BID x 3 days,  urine culture pending. UTI prevention discussed. Instructed to call if no relief of symptoms.

## 2013-03-20 NOTE — Patient Instructions (Addendum)
  Infeccin urinaria  (Urinary Tract Infection)  La infeccin urinaria puede ocurrir en cualquier lugar del tracto urinario. El tracto urinario es un sistema de drenaje del cuerpo por el que se eliminan los desechos y el exceso de agua. El tracto urinario est formado por dos riones, dos urteres, la vejiga y la uretra. Los riones son rganos que tienen forma de frijol. Cada rin tiene aproximadamente el tamao del puo. Estn situados debajo de las costillas, uno a cada lado de la columna vertebral CAUSAS  La causa de la infeccin son los microbios, que son organismos microscpicos, que incluyen hongos, virus, y bacterias. Estos organismos son tan pequeos que slo pueden verse a travs del microscopio. Las bacterias son los microorganismos que ms comnmente causan infecciones urinarias.  SNTOMAS  Los sntomas pueden variar segn la edad y el sexo del paciente y por la ubicacin de la infeccin. Los sntomas en las mujeres jvenes incluyen la necesidad frecuente e intensa de orinar y una sensacin dolorosa de ardor en la vejiga o en la uretra durante la miccin. Las mujeres y los hombres mayores podrn sentir cansancio, temblores y debilidad y sentir dolores musculares y dolor abdominal. Si tiene fiebre, puede significar que la infeccin est en los riones. Otros sntomas son dolor en la espalda o en los lados debajo de las costillas, nuseas y vmitos.  DIAGNSTICO  Para diagnosticar una infeccin urinaria, el mdico le preguntar acerca de sus sntomas. Tambin le solicitar una muestra de orina. La muestra de orina se analiza para detectar bacterias y glbulos blancos de la sangre. Los glbulos blancos se forman en el organismo para ayudar a combatir las infecciones.  TRATAMIENTO  Por lo general, las infecciones urinarias pueden tratarse con medicamentos. Debido a que la mayora de las infecciones son causadas por bacterias, por lo general pueden tratarse con antibiticos. La eleccin del  antibitico y la duracin del tratamiento depender de sus sntomas y el tipo de bacteria causante de la infeccin.  INSTRUCCIONES PARA EL CUIDADO EN EL HOGAR   Si le recetaron antibiticos, tmelos exactamente como su mdico le indique. Termine el medicamento aunque se sienta mejor despus de haber tomado slo algunos.  Beba gran cantidad de lquido para mantener la orina de tono claro o color amarillo plido.  Evite la cafena, el t y las bebidas gaseosas. Estas sustancias irritan la vejiga.  Vaciar la vejiga con frecuencia. Evite retener la orina durante largos perodos.  Vace la vejiga antes y despus de tener relaciones sexuales.  Despus de mover el intestino, las mujeres deben higienizarse la regin perineal desde adelante hacia atrs. Use slo un papel tissue por vez. SOLICITE ATENCIN MDICA SI:   Siente dolor en la espalda.  Le sube la fiebre.  Los sntomas no mejoran luego de 3 das. SOLICITE ATENCIN MDICA DE INMEDIATO SI:   Siente dolor intenso en la espalda o en la zona inferior del abdomen.  Comienza a sentir escalofros.  Tiene nuseas o vmitos.  Tiene una sensacin continua de quemazn o molestias al orinar. ASEGRESE DE QUE:   Comprende estas instrucciones.  Controlar su enfermedad.  Solicitar ayuda de inmediato si no mejora o empeora. Document Released: 03/30/2005 Document Revised: 03/14/2012 Exit[employer] Patient Information 2014 [employer].  

## 2013-03-23 LAB — URINE CULTURE: Colony Count: >=100000

## 2013-03-25 ENCOUNTER — Encounter: Payer: No Typology Code available for payment source | Admitting: Nurse Practitioner

## 2013-04-17 ENCOUNTER — Ambulatory Visit (INDEPENDENT_AMBULATORY_CARE_PROVIDER_SITE_OTHER): Payer: Self-pay | Admitting: Women's Health

## 2013-04-17 ENCOUNTER — Encounter: Payer: Self-pay | Admitting: Women's Health

## 2013-04-17 DIAGNOSIS — O200 Threatened abortion: Secondary | ICD-10-CM

## 2013-04-17 DIAGNOSIS — Z8742 Personal history of other diseases of the female genital tract: Secondary | ICD-10-CM

## 2013-04-17 DIAGNOSIS — Z8759 Personal history of other complications of pregnancy, childbirth and the puerperium: Secondary | ICD-10-CM

## 2013-04-17 NOTE — Progress Notes (Signed)
Patient ID: Ashley Holden, female   DOB: 15-Oct-1985, 27 y.o.   MRN: [id number] Patient presents with follow-up on positive UPT.  LMP 03/04/2013, [redacted]w[redacted]d per dates.  History of two ectopic pregnancies 08/2009 right ectopic, 08/2010 left ectopic, both treated with methotrexate.  Has mild mid-lower abdominal pain with some nausea.  Had more significant abdominal pain with prior ectopics.  Denies vaginal bleeding/ vomiting. A positive blood type. Hispanic with interpreter.  Exam:  Appears somewhat anxious due to previous ectopics.  Manual exam: no CMT, mild, mid-lower abdominal pain, no adnexal fullness or bleeding..    Early pregnancy History of ectopic pregnancy x2  Plan: Samples of DHA prenatal vitamins given and instructed to take daily.  Quant B-hCG today, if greater than 1000  ultrasound 10/17.

## 2013-04-19 ENCOUNTER — Encounter: Payer: Self-pay | Admitting: Women's Health

## 2013-04-19 ENCOUNTER — Ambulatory Visit (INDEPENDENT_AMBULATORY_CARE_PROVIDER_SITE_OTHER): Payer: Self-pay | Admitting: Women's Health

## 2013-04-19 ENCOUNTER — Ambulatory Visit (INDEPENDENT_AMBULATORY_CARE_PROVIDER_SITE_OTHER): Payer: Self-pay

## 2013-04-19 DIAGNOSIS — Z8742 Personal history of other diseases of the female genital tract: Secondary | ICD-10-CM

## 2013-04-19 DIAGNOSIS — Z8759 Personal history of other complications of pregnancy, childbirth and the puerperium: Secondary | ICD-10-CM

## 2013-04-19 DIAGNOSIS — O3680X0 Pregnancy with inconclusive fetal viability, not applicable or unspecified: Secondary | ICD-10-CM

## 2013-04-19 LAB — US OB TRANSVAGINAL

## 2013-04-19 NOTE — Patient Instructions (Signed)
Embarazo (Pregnancy) Si planea quedar embarazada, es una buena idea concertar una cita de preconcepcin con el mdico para poder lograr un estilo de vida saludable ante de quedar embarazada. Esto incluye dieta, peso, ejercicio, el tomar vitaminas prenatales en especial cido flico (ayuda a prevenir defectos en el cerebro y la mdula espinal), evitar el alcohol, fumar, las drogas ilegales, problemas mdicos (diabetes, convulsiones), historial familiar de problemas genticos, condiciones de trabajo e inmunizaciones. Es mejor tener conocimiento de estas cosas y hacer algo antes de quedar embarazada. Si est embarazada, es necesario que siga ciertas pautas para tener un beb sano. Es muy importante realizar controles prenatales adecuados y seguir las indicaciones del profesional que la asiste. La atencin prenatal incluye toda la asistencia mdica que usted recibe antes del nacimiento del beb. Esto ayuda a prevenir problemas durante el embarazo y el parto. INSTRUCCIONES PARA EL CUIDADO DOMICILIARIO  Comience las consultas prenatales alrededor de la 12 semana de embarazo o lo antes posible. Al principio generalmente se programan cada mes. Se hacen ms frecuentes en los 2 ltimos meses antes del parto. Es importante que concurra a todas las citas con el profesional y siga sus instrucciones con respecto a los medicamentos que deba utilizar, a la actividad fsica y a la dieta.  Durante el embarazo debe obtener nutrientes para usted y para su beb. Consuma una dieta normal y bien balanceada. Elija alimentos como carne, pescado, leche y otros productos lcteos, vegetales, frutas, panes integrales y cereales El profesional le informar cul es el aumento de peso ideal, segn su peso y altura actuales. Beba gran cantidad de lquidos. Trate de beber 8 vasos de lquidos por da.  El alcohol se asocia a cierto nmero de defectos del nacimiento, incluyendo el sndrome de alcoholismo fetal. Lo mejor es evitarlo  completamente El cigarrillo causa nacimientos prematuros y bebs de bajo peso al nacer. El consumo de alcohol y nicotina durante el embarazo tambin aumentan marcadamente la probabilidad de que el nio sea qumicamente dependiente en etapas posteriores de su vida y puede contribuir al sndrome de muerte sbita infantil (SMSI)  No consuma drogas.  Solo tome medicamentos prescriptos o de venta libre que le haya recomendado el profesional. Algunos medicamentos pueden causar problemas genticos y fsicos al beb  Las nuseas matinales pueden aliviarse si come algunas galletitas saladas en la cama. Coma dos galletitas antes de levantarse por la maana.  Las relaciones sexuales pueden continuarse hasta casi el final del embarazo, si no se presentan otros problemas como prdida prematura (antes de tiempo) de lquido amnitico, hemorragia vaginal, dolor durante las relaciones sexuales o dolor abdominal (en el vientre).  Practique ejercicios con regularidad. Consulte con el profesional que la asiste si no sabe con certeza si determinados ejercicios son seguros.  No utilice la baera con agua caliente, baos turcos y saunas. Estos aumentan el riesgo de sufrir un desmayo o de prdida del conocimiento, y as lastimarse usted o el beb. La natacin es un buen ejercicio. Descanse todo lo que pueda e incluya una siesta despus de almorzar siempre que le sea posible, especialmente durante el tercer trimestre.  Evite los olores y las sustancias qumicas txicas.  No use zapatos de tacones altos, podra perder el equilibrio y caer.  No levante objetos de ms de 2,5 kg. Si levanta un objeto, flexione las piernas y los muslos, y no la espalda.  Evite los viajes largos, especialmente en el tercer trimestre.  Si debe viajar fuera de la ciudad o de su estado, lleve una   copia de la historia clnica. SOLICITE ATENCIN MDICA DE INMEDIATO SI:  La temperatura oral se eleva sin motivo por encima de 102 F (38.9 C) o  segn le indique el profesional que lo asiste.  Tiene una prdida de lquido por la vagina. Si sospecha una ruptura de las membranas, tmese la temperatura y llame al profesional para informarlo sobre esto.  Observa unas pequeas manchas o una hemorragia vaginal Notifique al profesional acerca de la cantidad y de cuntos apsitos est utilizando.  Contina teniendo nuseas y no obtiene alivio de los medicamentos que le han indicado, o vomita sangre o una sustancia similar a la borra del caf.  Presenta un dolor en la zona superior del abdomen.  Siente molestias en el ligamento redondo en la parte abdominal baja. El profesional que la asiste la evaluar.  Siente pequeas contracciones del tero (matriz)  No siente que el beb se mueve, o percibe menos movimientos que antes.  Siente dolor al orinar.  Observa una hemorragia vaginal anormal.  Tiene diarrea persistente.  Sufre una cefalea grave.  Tiene problemas visuales.  Comienza a sentir debilidad muscular.  Se siente mareada o sufre un desmayo.  Comienza a sentir falta de aire.  Siente dolor en el pecho.  Sufre dolor en la espalda que se irradia hacia la pierna y el pie.  Siente latidos cardacos irregulares o la frecuencia cardaca es muy rpida.  Aumenta excesivamente de peso en un perodo breve (2,5 kg en 3 a 5 das)  Se ve envuelta en una situacin de violencia domstica. Document Released: 03/30/2005 Document Revised: 09/12/2011 [employer] Patient Information 2014 [employer].  

## 2013-04-19 NOTE — Progress Notes (Signed)
Patient ID: Ashley Holden, female   DOB: [birth year], 27 y.o.   MRN: [id number] Presents for ultrasound. Quant 100,000 04/17/13. History of 2 ectopic pregnancies 2011, 2012 treated with methotrexate. Denies pain, bleeding, taking prenatal vitamins daily.  Ultrasound: Intrauterine gestational sac with a yolk sac. Fetal pole seen with fetal heart tones 120. Ovaries appear normal with several small follicles seen on the left ovary. Trace amount of free fluid in the cul-de-sac.  Early pregnancy size equal dates  Plan: Congratulated , copy of ultrasound given. Instructed to schedule new OB care. Healthy pregnancy behaviors reviewed. Continue prenatal vitamins daily. Aware to avoid alcohol, over-the-counter medications and tuna, no pets. Blanca interpreted.

## 2013-05-16 LAB — OB RESULTS CONSOLE GC/CHLAMYDIA
Chlamydia: NEGATIVE
GC PROBE AMP, GENITAL: NEGATIVE

## 2013-05-16 LAB — OB RESULTS CONSOLE RPR: RPR: NONREACTIVE

## 2013-05-16 LAB — OB RESULTS CONSOLE RUBELLA ANTIBODY, IGM: Rubella: IMMUNE

## 2013-05-16 LAB — OB RESULTS CONSOLE ANTIBODY SCREEN: ANTIBODY SCREEN: NEGATIVE

## 2013-05-16 LAB — OB RESULTS CONSOLE HIV ANTIBODY (ROUTINE TESTING): HIV: NONREACTIVE

## 2013-05-16 LAB — OB RESULTS CONSOLE HEPATITIS B SURFACE ANTIGEN: Hepatitis B Surface Ag: NEGATIVE

## 2013-05-16 LAB — OB RESULTS CONSOLE ABO/RH: RH Type: POSITIVE

## 2013-07-04 NOTE — L&D Delivery Note (Signed)
Delivery Note At 5:19 AM a viable female was delivered via Vaginal, Spontaneous Delivery (Presentation: Middle Occiput Anterior).  APGAR: 9, 9; weight 7 lb 7.4 oz (3385 g).   Placenta status: Intact, Spontaneous.  Cord: 3 vessels with the following complications: None.  Cord pH: n/a  Anesthesia: None  Episiotomy: None Lacerations: 2nd degree Suture Repair: 3.0 vicryl Est. Blood Loss (mL): 300  Mom to postpartum.  Baby to Couplet care / Skin to Skin.  Pt pushed with good maternal effort to deliver a liveborn female via NSVD with spontaneous cry.   Baby placed on maternal abdomen.  Delayed cord clamping performed.  Placenta delivered intact with 3V cord via traction and pitocin.  2nd degree tear. No complications.  Mom and baby to postpartum.   Myra Rude 12/03/2013, 7:55 AM

## 2013-07-04 NOTE — L&D Delivery Note (Signed)
I was present for delivery and agree with note above. Areliz Rothman N Muhammad, CNM  

## 2013-11-12 LAB — OB RESULTS CONSOLE GBS: GBS: POSITIVE

## 2013-12-01 ENCOUNTER — Encounter (HOSPITAL_COMMUNITY): Payer: Self-pay | Admitting: *Deleted

## 2013-12-01 ENCOUNTER — Inpatient Hospital Stay (HOSPITAL_COMMUNITY)
Admission: AD | Admit: 2013-12-01 | Discharge: 2013-12-03 | DRG: 775 | Disposition: A | Discharge status: Home / Self Care | Payer: Medicaid Other | Source: Ambulatory Visit | Attending: Obstetrics and Gynecology | Admitting: Obstetrics and Gynecology

## 2013-12-01 DIAGNOSIS — O479 False labor, unspecified: Secondary | ICD-10-CM | POA: Diagnosis present

## 2013-12-01 DIAGNOSIS — O9989 Other specified diseases and conditions complicating pregnancy, childbirth and the puerperium: Secondary | ICD-10-CM

## 2013-12-01 DIAGNOSIS — O99892 Other specified diseases and conditions complicating childbirth: Secondary | ICD-10-CM | POA: Diagnosis present

## 2013-12-01 DIAGNOSIS — Z22330 Carrier of Group B streptococcus: Secondary | ICD-10-CM

## 2013-12-01 DIAGNOSIS — IMO0001 Reserved for inherently not codable concepts without codable children: Secondary | ICD-10-CM

## 2013-12-01 LAB — CBC
HCT: 39.3 % (ref 36.0–46.0)
HEMOGLOBIN: 13.5 g/dL (ref 12.0–15.0)
MCH: 30.8 pg (ref 26.0–34.0)
MCHC: 34.4 g/dL (ref 30.0–36.0)
MCV: 89.5 fL (ref 78.0–100.0)
Platelets: 241 10*3/uL (ref 150–400)
RBC: 4.39 MIL/uL (ref 3.87–5.11)
RDW: 14.5 % (ref 11.5–15.5)
WBC: 16.3 10*3/uL — ABNORMAL HIGH (ref 4.0–10.5)

## 2013-12-01 LAB — TYPE AND SCREEN
ABO/RH(D): A POS
ANTIBODY SCREEN: NEGATIVE

## 2013-12-01 MED ORDER — OXYTOCIN BOLUS FROM INFUSION
500.0000 mL | INTRAVENOUS | Status: DC
  Administered 2013-12-02: 500 mL via INTRAVENOUS

## 2013-12-01 MED ORDER — PENICILLIN G POTASSIUM 5000000 UNITS IJ SOLR
5.0000 10*6.[IU] | Freq: Once | INTRAVENOUS | Status: AC
  Administered 2013-12-01: 5 10*6.[IU] via INTRAVENOUS
  Filled 2013-12-01: qty 5

## 2013-12-01 MED ORDER — IBUPROFEN 600 MG PO TABS: 600.0000 mg | ORAL_TABLET | Freq: Four times a day (QID) | ORAL | Status: DC | PRN

## 2013-12-01 MED ORDER — LACTATED RINGERS IV SOLN
INTRAVENOUS | Status: DC
  Administered 2013-12-01: 22:00:00 via INTRAVENOUS

## 2013-12-01 MED ORDER — CITRIC ACID-SODIUM CITRATE 334-500 MG/5ML PO SOLN: 30.0000 mL | ORAL | Status: DC | PRN

## 2013-12-01 MED ORDER — OXYTOCIN 40 UNITS IN LACTATED RINGERS INFUSION - SIMPLE MED
62.5000 mL/h | INTRAVENOUS | Status: DC
  Administered 2013-12-02: 62.5 mL/h via INTRAVENOUS
  Filled 2013-12-01: qty 1000

## 2013-12-01 MED ORDER — OXYCODONE-ACETAMINOPHEN 5-325 MG PO TABS: 1.0000 | ORAL_TABLET | ORAL | Status: DC | PRN

## 2013-12-01 MED ORDER — LIDOCAINE HCL (PF) 1 % IJ SOLN
30.0000 mL | INTRAMUSCULAR | Status: DC | PRN
  Administered 2013-12-02: 30 mL via SUBCUTANEOUS
  Filled 2013-12-01: qty 30

## 2013-12-01 MED ORDER — FLEET ENEMA 7-19 GM/118ML RE ENEM: 1.0000 | ENEMA | RECTAL | Status: DC | PRN

## 2013-12-01 MED ORDER — FENTANYL CITRATE 0.05 MG/ML IJ SOLN
100.0000 ug | Freq: Once | INTRAMUSCULAR | Status: AC
  Administered 2013-12-01: 100 ug via INTRAVENOUS

## 2013-12-01 MED ORDER — LACTATED RINGERS IV SOLN: 500.0000 mL | INTRAVENOUS | Status: DC | PRN

## 2013-12-01 MED ORDER — FENTANYL CITRATE 0.05 MG/ML IJ SOLN
INTRAMUSCULAR | Status: AC
  Filled 2013-12-01: qty 2

## 2013-12-01 MED ORDER — PENICILLIN G POTASSIUM 5000000 UNITS IJ SOLR
2.5000 10*6.[IU] | INTRAVENOUS | Status: DC
  Administered 2013-12-02: 2.5 10*6.[IU] via INTRAVENOUS
  Filled 2013-12-01 (×4): qty 2.5

## 2013-12-01 MED ORDER — ACETAMINOPHEN 325 MG PO TABS: 650.0000 mg | ORAL_TABLET | ORAL | Status: DC | PRN

## 2013-12-01 MED ORDER — ONDANSETRON HCL 4 MG/2ML IJ SOLN: 4.0000 mg | Freq: Four times a day (QID) | INTRAMUSCULAR | Status: DC | PRN

## 2013-12-01 NOTE — H&P (Signed)
Ashley Holden is a 28 y.o. female presenting for contractions that started this morning.  Onset of care at [redacted] wks gestation at [facility].  Pregnancy dated by 6 wk ultrasound.  Pregnancy uncomplicated.  Prior OB history:  Ectopic pregnancy left and right, both treated with methotrexate.     Maternal Medical History:  Reason for admission: Contractions.   Contractions: Onset was 6-12 hours ago.   Frequency: regular.    Fetal activity: Perceived fetal activity is normal.   Last perceived fetal movement was within the past hour.    Prenatal complications: no prenatal complications Prenatal Complications - Diabetes: none.    OB History   Grav Para Term Preterm Abortions TAB SAB Ect Mult Living   3 0   2   2  0     Past Medical History  Diagnosis Date  . Oligomenorrhea   . Ectopic pregnancy     RIGHT ON 08/13/2009- METHOTREXATE AND 08/31/10 ON LEFT - METHOTREXATE   History reviewed. No pertinent past surgical history. Family History: family history is not on file. Social History:  reports that she has never smoked. She has never used smokeless tobacco. She reports that she does not drink alcohol or use illicit drugs. Review of Systems  Gastrointestinal: Positive for abdominal pain.  All other systems reviewed and are negative.   Dilation: 5.5 Effacement (%): 80 Station: -2 Exam by:: L. Munford RN Blood pressure 107/72, pulse 77, temperature 98.2 F (36.8 C), temperature source Oral, resp. rate 18, height 5\' 4"  (1.626 m), weight 68.493 kg (151 lb), last menstrual period 03/04/2013. Maternal Exam:  Uterine Assessment: Contraction strength is firm.  Contraction duration is 70 seconds. Contraction frequency is regular.   Abdomen: Estimated fetal weight is 7-7.5 lbs.   Fetal presentation: vertex  Introitus: Normal vulva.   Fetal Exam Fetal Monitor Review: Baseline rate: 120's.  Variability: moderate (6-25 bpm).   Pattern: accelerations present and early  decelerations.    Fetal State Assessment: Category I - tracings are normal.     Physical Exam  Constitutional: She is oriented to person, place, and time. She appears well-developed and well-nourished. No distress.  Appears uncomfortable  HENT:  Head: Normocephalic.  Neck: Normal range of motion. Neck supple.  Cardiovascular: Normal rate, regular rhythm and normal heart sounds.   Respiratory: Effort normal and breath sounds normal.  GI: Soft. There is no tenderness.  Genitourinary: No bleeding around the vagina.  Musculoskeletal: Normal range of motion. She exhibits no edema.  Neurological: She is alert and oriented to person, place, and time.  Skin: Skin is warm and dry.    Prenatal labs: ABO, Rh: A/Positive/-- (11/13 0000) Antibody: Negative (11/13 0000) Rubella: Immune (11/13 0000) RPR: Nonreactive (11/13 0000)  HBsAg: Negative (11/13 0000)  HIV: Non-reactive (11/13 0000)  GBS: Positive (05/12 0000)   Assessment/Plan: 28 yo G3P0020 at [redacted]w[redacted]d wks IUP Active Labor GBS pos  Plan: Admit to [facility] Suites GBS prophylaxis Fentanyl for pain Anticipate NSVD  Melissa Noon 12/01/2013, 11:11 PM

## 2013-12-01 NOTE — MAU Note (Signed)
Contractions every 5 minutes almost all day. Reports vaginal bleeding. No complications with this pregnancy.

## 2013-12-02 ENCOUNTER — Encounter (HOSPITAL_COMMUNITY): Payer: Self-pay

## 2013-12-02 DIAGNOSIS — Z22330 Carrier of Group B streptococcus: Secondary | ICD-10-CM

## 2013-12-02 DIAGNOSIS — O99892 Other specified diseases and conditions complicating childbirth: Secondary | ICD-10-CM

## 2013-12-02 DIAGNOSIS — O9989 Other specified diseases and conditions complicating pregnancy, childbirth and the puerperium: Secondary | ICD-10-CM

## 2013-12-02 LAB — RPR

## 2013-12-02 MED ORDER — DIBUCAINE 1 % RE OINT: 1.0000 "application " | TOPICAL_OINTMENT | RECTAL | Status: DC | PRN

## 2013-12-02 MED ORDER — IBUPROFEN 600 MG PO TABS
600.0000 mg | ORAL_TABLET | Freq: Four times a day (QID) | ORAL | Status: DC
  Administered 2013-12-02 – 2013-12-03 (×6): 600 mg via ORAL
  Filled 2013-12-02 (×6): qty 1

## 2013-12-02 MED ORDER — DIPHENHYDRAMINE HCL 25 MG PO CAPS: 25.0000 mg | ORAL_CAPSULE | Freq: Four times a day (QID) | ORAL | Status: DC | PRN

## 2013-12-02 MED ORDER — OXYCODONE-ACETAMINOPHEN 5-325 MG PO TABS
1.0000 | ORAL_TABLET | ORAL | Status: DC | PRN
  Administered 2013-12-02: 1 via ORAL
  Filled 2013-12-02: qty 1

## 2013-12-02 MED ORDER — SENNOSIDES-DOCUSATE SODIUM 8.6-50 MG PO TABS
2.0000 | ORAL_TABLET | ORAL | Status: DC
  Administered 2013-12-02: 2 via ORAL
  Filled 2013-12-02: qty 2

## 2013-12-02 MED ORDER — TETANUS-DIPHTH-ACELL PERTUSSIS 5-2.5-18.5 LF-MCG/0.5 IM SUSP
0.5000 mL | Freq: Once | INTRAMUSCULAR | Status: AC
  Administered 2013-12-03: 0.5 mL via INTRAMUSCULAR
  Filled 2013-12-02: qty 0.5

## 2013-12-02 MED ORDER — ONDANSETRON HCL 4 MG PO TABS: 4.0000 mg | ORAL_TABLET | ORAL | Status: DC | PRN

## 2013-12-02 MED ORDER — FENTANYL CITRATE 0.05 MG/ML IJ SOLN
50.0000 ug | Freq: Once | INTRAMUSCULAR | Status: AC
  Administered 2013-12-02: 50 ug via INTRAVENOUS
  Filled 2013-12-02: qty 2

## 2013-12-02 MED ORDER — LANOLIN HYDROUS EX OINT: TOPICAL_OINTMENT | CUTANEOUS | Status: DC | PRN

## 2013-12-02 MED ORDER — SIMETHICONE 80 MG PO CHEW: 80.0000 mg | CHEWABLE_TABLET | ORAL | Status: DC | PRN

## 2013-12-02 MED ORDER — BENZOCAINE-MENTHOL 20-0.5 % EX AERO
1.0000 "application " | INHALATION_SPRAY | CUTANEOUS | Status: DC | PRN
  Administered 2013-12-02: 1 via TOPICAL
  Filled 2013-12-02: qty 56

## 2013-12-02 MED ORDER — ONDANSETRON HCL 4 MG/2ML IJ SOLN: 4.0000 mg | INTRAMUSCULAR | Status: DC | PRN

## 2013-12-02 MED ORDER — PRENATAL MULTIVITAMIN CH
1.0000 | ORAL_TABLET | Freq: Every day | ORAL | Status: DC
  Administered 2013-12-02 – 2013-12-03 (×2): 1 via ORAL
  Filled 2013-12-02 (×2): qty 1

## 2013-12-02 MED ORDER — WITCH HAZEL-GLYCERIN EX PADS: 1.0000 "application " | MEDICATED_PAD | CUTANEOUS | Status: DC | PRN

## 2013-12-02 MED ORDER — ZOLPIDEM TARTRATE 5 MG PO TABS: 5.0000 mg | ORAL_TABLET | Freq: Every evening | ORAL | Status: DC | PRN

## 2013-12-02 NOTE — Progress Notes (Signed)
UR chart review completed.  

## 2013-12-02 NOTE — Progress Notes (Signed)
   Subjective: Reports increased pressure with contractions.  Requests additional pain meds.    Objective: BP 110/98  Pulse 77  Temp(Src) 99.2 F (37.3 C) (Oral)  Resp 18  Ht 5\' 4"  (1.626 m)  Wt 68.493 kg (151 lb)  BMI 25.91 kg/m2  LMP 03/04/2013      FHT:  FHR: 120's bpm, variability: moderate,  accelerations:  Present,  decelerations:  Absent UC:   regular, every 2-3 minutes SVE:   Dilation: 10 Effacement (%): 100 Station: 0 Exam by:: Roney Marion, CNM; AROM forebag  Labs: Lab Results  Component Value Date   WBC 16.3* 12/01/2013   HGB 13.5 12/01/2013   HCT 39.3 12/01/2013   MCV 89.5 12/01/2013   PLT 241 12/01/2013    Assessment / Plan: Spontaneous labor, progressing normally  Labor: Progressing normally Preeclampsia:  n/a Fetal Wellbeing:  Category I Pain Control:  Fentanyl I/D:  GBS pos Anticipated MOD:  NSVD  Ashley Holden 12/02/2013, 3:57 AM

## 2013-12-02 NOTE — Lactation Note (Signed)
This note was copied from the chart of Ashley Chrisy Abarca-Castro. Lactation Consultation Note  Patient Name: Ashley Holden IOMBT'D Date: 12/02/2013 Reason for consult: Initial assessment- Smitty Cords spanish interpreter present for consult -  Per dad baby recently fed in the last hour 30 mins, also had been feeding often all morning . Reassured mom and dad that is normal. Also his sleep stage is normal. Discussed the importance of skin to skin feedings, also if it has been a few hours and the baby hasn't woken Up to feed , check diaper , change if needed, and place the baby skin to skin. Showed mom and dad the indicator on the diaper for wets , and reviewed  The color of stools - meconium, transitional stool and yellow seedy and the timeline to expect. Importance of feeding assessment discussed and asked mom and  Dad asked to call on nurses light for LC to check latch . This is moms 1st baby . Mother informed of post-discharge support and given phone number to the lactation department, including services for phone call assistance; out-patient appointments; and breastfeeding support group. List of other breastfeeding resources in the community given in the handout. Encouraged mother to call for problems or concerns related to breastfeeding.   Maternal Data Formula Feeding for Exclusion: No Infant to breast within first hour of birth: Yes  Feeding Feeding Type:  (per dad recently breast fed ) Length of feed: 15 min  LATCH Score/Interventions                      Lactation Tools Discussed/Used WIC Program: Yes   Consult Status Consult Status: Follow-up (encouraged to page ) Date: 12/02/13 Follow-up type: In-patient    Ashley Holden 12/02/2013, 1:53 PM

## 2013-12-03 MED ORDER — IBUPROFEN 600 MG PO TABS: 600.0000 mg | ORAL_TABLET | Freq: Four times a day (QID) | ORAL | Status: DC

## 2013-12-03 NOTE — Discharge Instructions (Signed)
Cuidados en el postparto luego de un parto vaginal  (Postpartum Care After Vaginal Delivery) Despus del parto (perodo de postparto), la estada normal en el hospital es de 24-72 horas. Si hubo problemas con el trabajo de parto o el parto, o si tiene otros problemas mdicos, es posible que Patent attorney en el hospital por ms [place].  Mientras est en el hospital, recibir [place] e instrucciones sobre cmo cuidar de usted misma y de su beb recin nacido durante el postparto.  Mientras est en el hospital:   Asegrese de decirle a las enfermeras si siente dolor o Tree surgeon, as como donde Designer, television/film set y Architect.  Si usted tuvo una incisin cerca de la vagina (episiotoma) o si ha tenido Education officer, museum, las enfermeras le pondrn hielo sobre la episiotoma o Psychiatrist. Las bolsas de hielo pueden ayudar a Dietitian y la hinchazn.  Si est amamantando, puede sentir contracciones dolorosas en el tero durante algunas semanas. Esto es normal. Las contracciones ayudan a que el tero vuelva a su tamao normal.  Es normal tener algo de sangrado despus del [place].  Durante los primeros 1-3 das despus del parto, el flujo es de color rojo y la cantidad puede ser similar a un perodo.  Es frecuente que el flujo se inicie y se Production assistant, radio.  En los primeros [place], puede eliminar algunos cogulos pequeos. Informe a las enfermeras si elimina cogulos grandes o aumenta el flujo.  No  elimine los cogulos de sangre por el inodoro antes de que la [employer] vea.  Durante los prximos 3 a [address] despus del parto, el flujo debe ser ms acuoso y rosado o Forensic psychologist.  Chancy Hurter a catorce [employer] del parto, el flujo debe ser una pequea cantidad de secrecin de color blanco amarillento.  La cantidad de flujo disminuir en las primeras semanas despus del parto. El flujo puede detenerse en 6-8 semanas. La mayora de las mujeres no tienen ms flujo a las 12 semanas  despus del [place].  Usted debe cambiar sus apsitos con frecuencia.  Lvese bien las manos con agua y jabn durante al menos 20 segundos despus de cambiar el apsito, usar el bao o antes de Nature conservation officer o Research scientist (life sciences) a su recin nacido.  Usted podr sentir como que tiene que vaciar la vejiga durante las primeras 6-8 horas despus del [place].  En caso de que sienta debilidad, mareo o [place], llame a la enfermera antes de levantarse de la cama por primera vez y antes de tomar una ducha por primera vez.  Dentro de los [employer] del parto, sus mamas pueden comenzar a estar sensibles y [place]. Esto se llama congestin. La sensibilidad en los senos por lo general desaparece dentro de las 48-72 horas despus de que ocurre la congestin. Tambin puede notar que la [place] se escapa de sus senos. Si no est amamantando no estimule sus pechos. La estimulacin de las mamas hace que sus senos produzcan ms [place].  Pasar tanto tiempo como le sea posible con el beb recin nacido es muy importante. Durante ese tiempo, usted y su beb deben sentirse cerca y conocerse uno al otro. Tener al beb en su habitacin (alojamiento conjunto) ayudar a fortalecer el vnculo con el beb recin nacido.Esto le dar tiempo para conocerlo y atenderlo de [place] cmoda.  Las hormonas se modifican despus del parto. A veces, los cambios hormonales pueden causar tristeza o ganas de llorar por un tiempo. Estos sentimientos  no deben durar ms de unos pocos das. Si duran ms que eso, debe hablar con su mdico.  Si lo desea, hable con su mdico acerca de los mtodos de planificacin familiar o mtodos anticonceptivos.  Hable con su mdico acerca de las vacunas. El mdico puede indicarle que se aplique las siguientes vacunas antes de salir del hospital:  Vacuna contra el ttanos, la difteria y la tos ferina (Tdap) o el ttanos y la difteria (Td). Es muy importante que usted y su familia (incluyendo a los abuelos) u otras  personas que cuidan al recin nacido estn al da con las vacunas Tdap o Td. Las vacunas Tdap o Td pueden ayudar a proteger al recin nacido de enfermedades.  Inmunizacin contra la rubola.  Inmunizacin contra la varicela.  Inmunizacin contra la gripe. Usted debe recibir esta vacunacin anual si no la ha recibido durante el embarazo. Document Released: 04/17/2007 Document Revised: 03/14/2012 ExitCare Patient Information 2014 Exit[employer].  

## 2013-12-03 NOTE — Discharge Summary (Signed)
Obstetric Discharge Summary Reason for Admission: onset of labor Prenatal Procedures: ultrasound Intrapartum Procedures: spontaneous vaginal delivery Postpartum Procedures: none Complications-Operative and Postpartum: 2nd degree perineal laceration Hemoglobin  Date Value Ref Range Status  12/01/2013 13.5  12.0 - 15.0 g/dL Final     HCT  Date Value Ref Range Status  12/01/2013 39.3  36.0 - 46.0 % Final   Ashley Holden is a 28 y.o. female presenting for active labor. Prior OB history: Ectopic pregnancy left and right, both treated with methotrexate. At 5:19 AM ( 6/02) a viable female was delivered via Vaginal, Spontaneous Delivery (Presentation: Middle Occiput Anterior). APGAR: 9, 9; weight 7 lb 7.4 oz (3385 g). Placenta status: Intact, Spontaneous. She is planning on outpatient circumcision. She is breastfeeding and considering the nexplanon or mirena for contraception.    Physical Exam:  General: alert, cooperative and no distress Lochia: appropriate Uterine Fundus: firm Incision: n/a DVT Evaluation: No evidence of DVT seen on physical exam.  Discharge Diagnoses: Term Pregnancy-delivered  Discharge Information: Date: 12/03/2013 Activity: unrestricted Diet: routine Medications: Ibuprofen Condition: stable Instructions: refer to practice specific booklet Discharge to: home Follow-up Information   Follow [facility] In 4 weeks. (postpartum follow up)    Contact information:   [address] [place] [address] [phone]      Newborn Data: Live born female  Birth Weight: 7 lb 7.4 oz (3385 g) APGAR: 9, 9  Home with mother.  Myra Rude 12/03/2013, 7:52 AM  I spoke with and examined patient and agree with resident's note and plan of care.  Tawana Scale, MD OB Fellow 12/04/2013 7:57 AM

## 2013-12-04 ENCOUNTER — Ambulatory Visit: Payer: Self-pay

## 2013-12-04 NOTE — Lactation Note (Addendum)
This note was copied from the chart of Ashley Holden. Lactation Consultation Note  Patient Name: Ashley Holden YOVZC'H Date: 12/04/2013 Reason for consult: Follow-up assessment LC saw mom for D/C breast feeding teaching - per dad, per mom hasn't been breast feeding on the left due to sore ness, right nipple not has sore. LC assessed breast tissue and noted positional strips, compressible areolas , breast are filling bilaterally. Mom willing to latch the baby on the right. LC reviewed basics - breast massage, hand express, steady flow of colostrum noted, Baby latched in football position with multiply swallows, increased with breast compressions. Discussed with both parents due to baby being D/C with Double phot tx , recommended renting a DEBP , and dad declined for now, DEBP set kit was demo manually for mom with dad's help. And also #27 flange given for when the milk comes in . Reviewed sore nipple and engorgement prevention and tx. Mom and dad aware they can call [facility] O/P services with questions or BF concerns. LC stressed to dad to call if issues engorgement.  Is aware the baby needs to feed every 3 hours and with feeding cues.    Maternal Data Has patient been taught Hand Expression?: Yes (steady flow of transitional milk noted )  Feeding Feeding Type: Breast Fed Length of feed: 7 min (LC observed and assisted )  LATCH Score/Interventions Latch: Grasps breast easily, tongue down, lips flanged, rhythmical sucking. (right breast , per mom the left to sore ) Intervention(s): Adjust position;Assist with latch;Breast massage;Breast compression  Audible Swallowing: Spontaneous and intermittent  Type of Nipple: Everted at rest and after stimulation  Comfort (Breast/Nipple): Filling, red/small blisters or bruises, mild/mod discomfort  Problem noted: Filling;Cracked, bleeding, blisters, bruises;Mild/Moderate discomfort (per mom the latch was more comfortable  ) Interventions (Filling): Firm support Interventions  (Cracked/bleeding/bruising/blister): Expressed breast milk to nipple;Reverse pressure  Hold (Positioning): Assistance needed to correctly position infant at breast and maintain latch. Intervention(s): Breastfeeding basics reviewed;Support Pillows;Position options;Skin to skin  LATCH Score: 8  Lactation Tools Discussed/Used Tools: Shells;Pump;Comfort gels;Flanges Flange Size: 27 Shell Type: Inverted Breast pump type: Double-Electric Breast Pump (with instruction manually, and if need to rent ) [facility] Program: No (per dad hasn't applied yet , plans to ) Pump Review: Setup, frequency, and cleaning;Milk Storage   Consult Status Consult Status: Complete Date: 12/04/13    Ashley Holden 12/04/2013, 3:15 PM

## 2014-05-05 ENCOUNTER — Encounter (HOSPITAL_COMMUNITY): Payer: Self-pay

## 2018-07-04 NOTE — L&D Delivery Note (Addendum)
Delivery Note Arrived in MAU and deemed to be 9cm, brought to Labor unit We were able to get an IV in but unable to get Penicillin in before she delivered.  At 9:41 PM a viable and healthy female was delivered via Vaginal, Spontaneous (Presentation:   Occiput Anterior).  APGAR: 8, 9; weight  .  Spontaneous rupture of membranes occurred just prior to delivery, light meconium stained fluid. Placenta status: Spontaneous, Intact.  Cord: 3 vessels with the following complications: None.   Anesthesia: None Episiotomy: None Lacerations: 1st degree;Perineal Suture Repair: vicryl rapide Est. Blood Loss (mL): 500  Mom to postpartum.  Baby to Couplet care / Skin to Skin.  Hansel Feinstein 06/12/2019, 10:31 PM  Please schedule this patient for Postpartum visit in: 4 weeks with the following provider: Any provider For C/S patients schedule nurse incision check in weeks 2 weeks: no Low risk pregnancy complicated by: none Delivery mode:  SVD Anticipated Birth Control:  Condoms PP Procedures needed: none  Schedule Integrated [place] visit: no

## 2019-01-10 LAB — OB RESULTS CONSOLE HEPATITIS B SURFACE ANTIGEN: Hepatitis B Surface Ag: NEGATIVE

## 2019-01-10 LAB — OB RESULTS CONSOLE RUBELLA ANTIBODY, IGM: Rubella: IMMUNE

## 2019-01-10 LAB — OB RESULTS CONSOLE HIV ANTIBODY (ROUTINE TESTING): HIV: NONREACTIVE

## 2019-01-15 LAB — OB RESULTS CONSOLE GBS: GBS: POSITIVE

## 2019-01-18 ENCOUNTER — Other Ambulatory Visit (HOSPITAL_COMMUNITY): Payer: Self-pay | Admitting: Nurse Practitioner

## 2019-01-18 DIAGNOSIS — Z3A18 18 weeks gestation of pregnancy: Secondary | ICD-10-CM

## 2019-01-18 DIAGNOSIS — Z3689 Encounter for other specified antenatal screening: Secondary | ICD-10-CM

## 2019-01-22 ENCOUNTER — Other Ambulatory Visit: Payer: Self-pay

## 2019-01-22 ENCOUNTER — Ambulatory Visit (HOSPITAL_COMMUNITY)
Admission: RE | Admit: 2019-01-22 | Discharge: 2019-01-22 | Disposition: A | Discharge status: Home / Self Care | Payer: Self-pay | Source: Ambulatory Visit | Attending: Obstetrics and Gynecology | Admitting: Obstetrics and Gynecology

## 2019-01-22 ENCOUNTER — Other Ambulatory Visit (HOSPITAL_COMMUNITY): Payer: Self-pay | Admitting: Nurse Practitioner

## 2019-01-22 DIAGNOSIS — Z3689 Encounter for other specified antenatal screening: Secondary | ICD-10-CM | POA: Insufficient documentation

## 2019-01-22 DIAGNOSIS — Z3A18 18 weeks gestation of pregnancy: Secondary | ICD-10-CM | POA: Insufficient documentation

## 2019-01-22 DIAGNOSIS — Z363 Encounter for antenatal screening for malformations: Secondary | ICD-10-CM

## 2019-01-23 ENCOUNTER — Other Ambulatory Visit (HOSPITAL_COMMUNITY): Payer: Self-pay | Admitting: *Deleted

## 2019-01-23 DIAGNOSIS — Z362 Encounter for other antenatal screening follow-up: Secondary | ICD-10-CM

## 2019-02-14 ENCOUNTER — Encounter (HOSPITAL_COMMUNITY): Payer: Self-pay | Admitting: *Deleted

## 2019-02-19 ENCOUNTER — Ambulatory Visit (HOSPITAL_COMMUNITY)
Admission: RE | Admit: 2019-02-19 | Discharge: 2019-02-19 | Disposition: A | Discharge status: Home / Self Care | Payer: Self-pay | Source: Ambulatory Visit | Attending: Obstetrics and Gynecology | Admitting: Obstetrics and Gynecology

## 2019-02-19 ENCOUNTER — Ambulatory Visit (HOSPITAL_COMMUNITY): Payer: Self-pay | Admitting: *Deleted

## 2019-02-19 ENCOUNTER — Other Ambulatory Visit: Payer: Self-pay

## 2019-02-19 ENCOUNTER — Encounter (HOSPITAL_COMMUNITY): Payer: Self-pay

## 2019-02-19 VITALS — BP 103/64 | HR 83 | Temp 98.6°F | Ht 62.0 in | Wt 152.4 lb

## 2019-02-19 DIAGNOSIS — O0990 Supervision of high risk pregnancy, unspecified, unspecified trimester: Secondary | ICD-10-CM

## 2019-02-19 DIAGNOSIS — Z3A22 22 weeks gestation of pregnancy: Secondary | ICD-10-CM

## 2019-02-19 DIAGNOSIS — Z362 Encounter for other antenatal screening follow-up: Secondary | ICD-10-CM

## 2019-04-02 LAB — OB RESULTS CONSOLE HIV ANTIBODY (ROUTINE TESTING): HIV: NONREACTIVE

## 2019-06-12 ENCOUNTER — Encounter (HOSPITAL_COMMUNITY): Payer: Self-pay | Admitting: Advanced Practice Midwife

## 2019-06-12 ENCOUNTER — Other Ambulatory Visit: Payer: Self-pay

## 2019-06-12 ENCOUNTER — Inpatient Hospital Stay (HOSPITAL_COMMUNITY)
Admission: AD | Admit: 2019-06-12 | Discharge: 2019-06-14 | DRG: 807 | Disposition: A | Discharge status: Home / Self Care | Payer: Medicaid Other | Attending: Obstetrics and Gynecology | Admitting: Obstetrics and Gynecology

## 2019-06-12 DIAGNOSIS — Z20828 Contact with and (suspected) exposure to other viral communicable diseases: Secondary | ICD-10-CM | POA: Diagnosis present

## 2019-06-12 DIAGNOSIS — O99824 Streptococcus B carrier state complicating childbirth: Secondary | ICD-10-CM | POA: Diagnosis present

## 2019-06-12 DIAGNOSIS — Z603 Acculturation difficulty: Secondary | ICD-10-CM

## 2019-06-12 DIAGNOSIS — Z3A39 39 weeks gestation of pregnancy: Secondary | ICD-10-CM

## 2019-06-12 DIAGNOSIS — O26893 Other specified pregnancy related conditions, third trimester: Secondary | ICD-10-CM | POA: Diagnosis present

## 2019-06-12 DIAGNOSIS — O0930 Supervision of pregnancy with insufficient antenatal care, unspecified trimester: Secondary | ICD-10-CM

## 2019-06-12 LAB — TYPE AND SCREEN
ABO/RH(D): A POS
Antibody Screen: NEGATIVE

## 2019-06-12 LAB — RESPIRATORY PANEL BY RT PCR (FLU A&B, COVID)
Influenza A by PCR: NEGATIVE
Influenza B by PCR: NEGATIVE
SARS Coronavirus 2 by RT PCR: NEGATIVE

## 2019-06-12 MED ORDER — ONDANSETRON HCL 4 MG/2ML IJ SOLN: 4.0000 mg | Freq: Four times a day (QID) | INTRAMUSCULAR | Status: DC | PRN

## 2019-06-12 MED ORDER — LIDOCAINE HCL (PF) 1 % IJ SOLN: 30.0000 mL | INTRAMUSCULAR | Status: DC | PRN

## 2019-06-12 MED ORDER — OXYCODONE-ACETAMINOPHEN 5-325 MG PO TABS: 1.0000 | ORAL_TABLET | ORAL | Status: DC | PRN

## 2019-06-12 MED ORDER — LACTATED RINGERS IV SOLN
INTRAVENOUS | Status: DC
  Administered 2019-06-12: 22:00:00 via INTRAVENOUS

## 2019-06-12 MED ORDER — OXYTOCIN 40 UNITS IN NORMAL SALINE INFUSION - SIMPLE MED
INTRAVENOUS | Status: AC
  Filled 2019-06-12: qty 1000

## 2019-06-12 MED ORDER — OXYCODONE-ACETAMINOPHEN 5-325 MG PO TABS: 2.0000 | ORAL_TABLET | ORAL | Status: DC | PRN

## 2019-06-12 MED ORDER — OXYTOCIN BOLUS FROM INFUSION
500.0000 mL | Freq: Once | INTRAVENOUS | Status: AC
  Administered 2019-06-12: 500 mL via INTRAVENOUS

## 2019-06-12 MED ORDER — OXYTOCIN 40 UNITS IN NORMAL SALINE INFUSION - SIMPLE MED: 2.5000 [IU]/h | INTRAVENOUS | Status: DC

## 2019-06-12 MED ORDER — LIDOCAINE HCL (PF) 1 % IJ SOLN
INTRAMUSCULAR | Status: AC
  Filled 2019-06-12: qty 30

## 2019-06-12 MED ORDER — LACTATED RINGERS IV SOLN: 500.0000 mL | INTRAVENOUS | Status: DC | PRN

## 2019-06-12 MED ORDER — ACETAMINOPHEN 325 MG PO TABS: 650.0000 mg | ORAL_TABLET | ORAL | Status: DC | PRN

## 2019-06-12 MED ORDER — SOD CITRATE-CITRIC ACID 500-334 MG/5ML PO SOLN: 30.0000 mL | ORAL | Status: DC | PRN

## 2019-06-12 NOTE — H&P (Signed)
Ashley Holden is a 33 y.o. female presenting for labor.  Cervix dilated 9cm on admission, taken directly to Labor and Delivery  Patient Active Problem List   Diagnosis Date Noted  . Postpartum care following vaginal delivery 06/12/2019  . Language barrier, cultural differences 06/12/2019  . Late prenatal care 06/12/2019  . Normal labor and delivery 06/12/2019  . History of ectopic pregnancy 03/28/2012   . OB History    Gravida  4   Para  1   Term  1   Preterm      AB  2   Living  1     SAB      TAB      Ectopic  2   Multiple      Live Births  1          Past Medical History:  Diagnosis Date  . Ectopic pregnancy    RIGHT ON 08/13/2009- METHOTREXATE AND 08/31/10 ON LEFT - METHOTREXATE  . Medical history non-contributory   . Oligomenorrhea    Past Surgical History:  Procedure Laterality Date  . NO PAST SURGERIES     Family History: family history is not on file. Social History:  reports that she has never smoked. She has never used smokeless tobacco. She reports that she does not drink alcohol or use drugs.     Maternal Diabetes: No Had a 1 hour Glucola of 136 but normal 3 hour Genetic Screening: Declined Maternal Ultrasounds/Referrals: Normal Fetal Ultrasounds or other Referrals:  None Maternal Substance Abuse:  No Significant Maternal Medications:  None Significant Maternal Lab Results:  Group B Strep positive Other Comments:  Unable to give penicillin for GBS due to rapid delivery  Review of Systems  Constitutional: Negative for chills and fever.  Eyes: Negative for blurred vision.  Respiratory: Negative for shortness of breath.   Gastrointestinal: Positive for abdominal pain. Negative for constipation, diarrhea, nausea and vomiting.  Musculoskeletal: Positive for back pain.   Maternal Medical History:  Reason for admission: Contractions.  Nausea.  Contractions: Onset was 1-2 hours ago.   Frequency: regular.   Perceived severity is  strong.    Fetal activity: Perceived fetal activity is normal.   Last perceived fetal movement was within the past hour.    Prenatal complications: No bleeding, PIH, placental abnormality, pre-eclampsia or preterm labor.   Prenatal Complications - Diabetes: none.    Dilation: 10 Exam by:: Noni Saupe, NP Blood pressure 106/77, pulse 97, last menstrual period 09/12/2018, unknown if currently breastfeeding. Maternal Exam:  Uterine Assessment: Contraction strength is firm.  Contraction frequency is regular.   Abdomen: Patient reports no abdominal tenderness. Estimated fetal weight is 6.5.   Fetal presentation: vertex  Introitus: Normal vulva. Normal vagina.  Ferning test: not done.  Nitrazine test: not done.  Pelvis: adequate for delivery.   Cervix: Cervix evaluated by digital exam.     Fetal Exam Fetal Monitor Review: Mode: ultrasound.   Baseline rate: 140.  Variability: moderate (6-25 bpm).   Pattern: no decelerations.    Fetal State Assessment: Category I - tracings are normal.     Physical Exam  Constitutional: She is oriented to person, place, and time. She appears well-developed and well-nourished. No distress.  Cardiovascular: Normal rate and regular rhythm.  Respiratory: Effort normal. No respiratory distress.  GI: Soft. She exhibits no distension and no mass. There is no abdominal tenderness. There is no rebound and no guarding.  Genitourinary:    Vulva, vagina and uterus normal.  Genitourinary Comments: Cervix anterior lip/100/0 station/ vertex     Musculoskeletal: Normal range of motion.  Neurological: She is alert and oriented to person, place, and time.  Skin: Skin is warm and dry.  Psychiatric: She has a normal mood and affect.    Prenatal labs: ABO, Rh:  A+ Antibody:   Rubella:   RPR:    HBsAg:    HIV:    GBS: Positive/-- (07/14 0000)   Assessment/Plan: [place] intrauterine pregnancy at [redacted]w[redacted]d Active Labor, transition Group B strep in  urine earlier in pregnancy  Admit to Labor and Delivery Routine orders Prepare for delivery   Wynelle Bourgeois 06/12/2019, 10:24 PM

## 2019-06-12 NOTE — MAU Note (Signed)
Pt arrived to MAU in active labor. RN obtained FHR at 150 and cervical exam was 10 cm. Provider Altamease Oiler, NP made aware. Provider checked patient per provider safe to expedite transfer to L&D.   Ashley Holden, CNM at bedside for transport with RN

## 2019-06-12 NOTE — MAU Note (Signed)
Covid swab obtained without difficulty and pt tol well. No symptoms 

## 2019-06-12 NOTE — Discharge Summary (Signed)
Postpartum Discharge Summary    Patient Name: Ashley Holden DOB: 03/11/1986 MRN: [id number]  Date of admission: 06/12/2019 Delivering Provider: Seabron Spates   Date of discharge: 06/14/2019  Admitting diagnosis: CTX Intrauterine pregnancy: [redacted]w[redacted]d    Secondary diagnosis:  Active Problems:   Postpartum care following vaginal delivery   Language barrier, cultural differences   Late prenatal care   Normal labor and delivery Meconium stained amniotic fluid  Additional problems: none     Discharge diagnosis: Term Pregnancy Delivered                                                                                                Post partum procedures:none  Augmentation: none  Complications: None  Hospital course:  Onset of Labor With Vaginal Delivery     33y.o. yo [id number] admitted in Active Labor on 06/12/2019. Patient had an uncomplicated labor course as follows:  Arrived in MAU at 9cm dilation in active labor Transferred to Labor and Delivery and delivered soon thereafter Light MSAF spontaneous rupture just prior to delivery No time for PCN prophylaxis Small 1st degree laceration repaired  Membrane Rupture Time/Date: 9:30 PM ,06/12/2019   Intrapartum Procedures: Episiotomy: None [1]                                         Lacerations:  1st degree [2];Perineal [11]  Patient had a delivery of a Viable infant. 06/12/2019  Information for the patient's newborn:  AbJenisha FaisonGirl Au[place]0[[id number]]Delivery Method: Vaginal, Spontaneous(Filed from Delivery Summary)     Pateint had an uncomplicated postpartum course.  She is ambulating, tolerating a regular diet, passing flatus, and urinating well. Patient is discharged home in stable condition on 06/14/19.  Delivery time: 9:41 PM    Magnesium Sulfate received: No BMZ received: No Rhophylac:N/A MMR:N/A Transfusion:No  Physical exam  Vitals:   06/13/19 1319 06/13/19 2130 06/13/19 2338 06/14/19  0535  BP: 95/65 94/68  98/75  Pulse: (!) 57 65  73  Resp: '18 18  16  '$ Temp:  99.2 F (37.3 C)  98 F (36.7 C)  TempSrc:  Oral  Oral  SpO2:    100%  Weight:   73 kg   Height:   '5\' 2"'$  (1.575 m)    General: alert, cooperative and no distress Lochia: appropriate Uterine Fundus: firm Incision: N/A DVT Evaluation: No evidence of DVT seen on physical exam. No significant calf/ankle edema. Labs: Lab Results  Component Value Date   WBC 12.0 (H) 06/12/2019   HGB 14.9 06/12/2019   HCT 42.8 06/12/2019   MCV 90.1 06/12/2019   PLT 246 06/12/2019   CMP Latest Ref Rng & Units 12/19/2012  Glucose 70 - 99 mg/dL 82  BUN 6 - 23 mg/dL 13  Creatinine 0.50 - 1.10 mg/dL 0.80  Sodium 135 - 145 mEq/L 137  Potassium 3.5 - 5.3 mEq/L 4.3  Chloride 96 - 112 mEq/L 105  CO2 19 - 32 mEq/L 25  Calcium 8.4 - 10.5 mg/dL 9.1  AST 0 - 37 U/L -    Discharge instruction: per After Visit Summary and "Baby and Me Booklet".  After visit meds:  Allergies as of 06/14/2019   No Known Allergies     Medication List    TAKE these medications   acetaminophen 325 MG tablet Commonly known as: Tylenol Take 2 tablets (650 mg total) by mouth every 4 (four) hours as needed (for pain scale < 4).   ibuprofen 600 MG tablet Commonly known as: ADVIL Take 1 tablet (600 mg total) by mouth every 6 (six) hours.   polyethylene glycol powder 17 GM/SCOOP powder Commonly known as: GLYCOLAX/MIRALAX Take 255 g by mouth once for 1 dose.   prenatal multivitamin Tabs tablet Take 1 tablet by mouth daily at 12 noon.       Diet: routine diet  Activity: Advance as tolerated. Pelvic rest for 6 weeks.   Outpatient follow up:6 weeks Follow up Appt:No future appointments. Follow up Visit:   Please schedule this patient for Postpartum visit in: 4 weeks with the following provider: Any provider For C/S patients schedule nurse incision check in weeks 2 weeks: no Low risk pregnancy complicated by: none Delivery mode:   SVD Anticipated Birth Control:  Condoms vs OCPs PP Procedures needed: none  Schedule Integrated BH visit: no    Newborn Data: Live born female  Birth Weight:   APGAR: 27, 9  Newborn Delivery   Birth date/time: 06/12/2019 21:41:00 Delivery type: Vaginal, Spontaneous      Baby Feeding: Bottle and Breast Disposition:home with mother   06/14/2019 Clarnce Flock, MD

## 2019-06-13 ENCOUNTER — Encounter (HOSPITAL_COMMUNITY): Payer: Self-pay

## 2019-06-13 LAB — CBC
HCT: 42.8 % (ref 36.0–46.0)
Hemoglobin: 14.9 g/dL (ref 12.0–15.0)
MCH: 31.4 pg (ref 26.0–34.0)
MCHC: 34.8 g/dL (ref 30.0–36.0)
MCV: 90.1 fL (ref 80.0–100.0)
Platelets: 246 10*3/uL (ref 150–400)
RBC: 4.75 MIL/uL (ref 3.87–5.11)
RDW: 14.4 % (ref 11.5–15.5)
WBC: 12 10*3/uL — ABNORMAL HIGH (ref 4.0–10.5)
nRBC: 0 % (ref 0.0–0.2)

## 2019-06-13 LAB — ABO/RH: ABO/RH(D): A POS

## 2019-06-13 LAB — RPR: RPR Ser Ql: NONREACTIVE

## 2019-06-13 MED ORDER — SIMETHICONE 80 MG PO CHEW: 80.0000 mg | CHEWABLE_TABLET | ORAL | Status: DC | PRN

## 2019-06-13 MED ORDER — ONDANSETRON HCL 4 MG/2ML IJ SOLN: 4.0000 mg | INTRAMUSCULAR | Status: DC | PRN

## 2019-06-13 MED ORDER — ONDANSETRON HCL 4 MG PO TABS: 4.0000 mg | ORAL_TABLET | ORAL | Status: DC | PRN

## 2019-06-13 MED ORDER — SENNOSIDES-DOCUSATE SODIUM 8.6-50 MG PO TABS
2.0000 | ORAL_TABLET | ORAL | Status: DC
  Administered 2019-06-13: 2 via ORAL
  Filled 2019-06-13: qty 2

## 2019-06-13 MED ORDER — ACETAMINOPHEN 325 MG PO TABS
650.0000 mg | ORAL_TABLET | ORAL | Status: DC | PRN
  Administered 2019-06-13 (×4): 650 mg via ORAL
  Filled 2019-06-13 (×4): qty 2

## 2019-06-13 MED ORDER — COCONUT OIL OIL: 1.0000 "application " | TOPICAL_OIL | Status: DC | PRN

## 2019-06-13 MED ORDER — PRENATAL MULTIVITAMIN CH: 1.0000 | ORAL_TABLET | Freq: Every day | ORAL | Status: DC

## 2019-06-13 MED ORDER — DIBUCAINE (PERIANAL) 1 % EX OINT: 1.0000 "application " | TOPICAL_OINTMENT | CUTANEOUS | Status: DC | PRN

## 2019-06-13 MED ORDER — WITCH HAZEL-GLYCERIN EX PADS: 1.0000 "application " | MEDICATED_PAD | CUTANEOUS | Status: DC | PRN

## 2019-06-13 MED ORDER — PRENATAL MULTIVITAMIN CH
1.0000 | ORAL_TABLET | Freq: Every day | ORAL | Status: DC
  Administered 2019-06-13 – 2019-06-14 (×2): 1 via ORAL
  Filled 2019-06-13 (×2): qty 1

## 2019-06-13 MED ORDER — ZOLPIDEM TARTRATE 5 MG PO TABS: 5.0000 mg | ORAL_TABLET | Freq: Every evening | ORAL | Status: DC | PRN

## 2019-06-13 MED ORDER — IBUPROFEN 600 MG PO TABS
600.0000 mg | ORAL_TABLET | Freq: Four times a day (QID) | ORAL | Status: DC
  Administered 2019-06-13 – 2019-06-14 (×8): 600 mg via ORAL
  Filled 2019-06-13 (×7): qty 1

## 2019-06-13 MED ORDER — DIPHENHYDRAMINE HCL 25 MG PO CAPS: 25.0000 mg | ORAL_CAPSULE | Freq: Four times a day (QID) | ORAL | Status: DC | PRN

## 2019-06-13 MED ORDER — TETANUS-DIPHTH-ACELL PERTUSSIS 5-2.5-18.5 LF-MCG/0.5 IM SUSP: 0.5000 mL | Freq: Once | INTRAMUSCULAR | Status: DC

## 2019-06-13 MED ORDER — ACETAMINOPHEN 325 MG PO TABS: 650.0000 mg | ORAL_TABLET | ORAL | Status: DC | PRN

## 2019-06-13 MED ORDER — BENZOCAINE-MENTHOL 20-0.5 % EX AERO: 1.0000 "application " | INHALATION_SPRAY | CUTANEOUS | Status: DC | PRN

## 2019-06-13 MED ORDER — SENNOSIDES-DOCUSATE SODIUM 8.6-50 MG PO TABS: 2.0000 | ORAL_TABLET | ORAL | Status: DC

## 2019-06-13 MED ORDER — IBUPROFEN 600 MG PO TABS: 600.0000 mg | ORAL_TABLET | Freq: Four times a day (QID) | ORAL | Status: DC

## 2019-06-13 NOTE — Progress Notes (Signed)
Post Partum Day 1 Subjective: no complaints, up ad lib, voiding and tolerating PO  Objective: Blood pressure 103/73, pulse 61, temperature 97.8 F (36.6 C), temperature source Axillary, resp. rate 18, last menstrual period 09/12/2018, unknown if currently breastfeeding.  Physical Exam:  General: alert, cooperative and no distress Lochia: appropriate Uterine Fundus: firm Incision: n/a DVT Evaluation: No evidence of DVT seen on physical exam.  Recent Labs    06/12/19 2135  HGB 14.9  HCT 42.8    Assessment/Plan: Plan for discharge tomorrow and Breastfeeding   LOS: 1 day   Hansel Feinstein 06/13/2019, 6:24 AM

## 2019-06-13 NOTE — Lactation Note (Signed)
This note was copied from a baby's chart. Lactation Consultation Note  Patient Name: Ashley Holden EUMPN'T Date: 06/13/2019 Reason for consult: Initial assessment;Term  Visited with P2 Mom and FOB at 15 hrs post delivery.  Mom resting in bed with baby STS on her chest.    Baby has breastfed 5 times since birth and Mom denies any difficulty latching baby.  Mom denies pain with latch.  Reviewed basics of breast feeding and encouraged Mom to keep baby STS as much as possible, offering breast with cues.   Reviewed breast massage and hand expression with Mom.  Colostrum expressed easily.  Lactation brochure left with parents.  Mom aware of IP lactation support available to her and encouraged her to call for assistance prn.   Consult Status Consult Status: Follow-up Date: 06/14/19 Follow-up type: In-patient    [name] 06/13/2019, 12:55 PM

## 2019-06-14 MED ORDER — ACETAMINOPHEN 325 MG PO TABS: 650.0000 mg | ORAL_TABLET | ORAL | 0 refills | Status: AC | PRN

## 2019-06-14 MED ORDER — POLYETHYLENE GLYCOL 3350 17 GM/SCOOP PO POWD: 1.0000 | Freq: Once | ORAL | 0 refills | Status: AC

## 2019-06-14 MED ORDER — IBUPROFEN 600 MG PO TABS: 600.0000 mg | ORAL_TABLET | Freq: Four times a day (QID) | ORAL | 0 refills | Status: AC

## 2019-06-14 NOTE — Discharge Instructions (Signed)

## 2019-06-14 NOTE — Lactation Note (Signed)
This note was copied from a baby's chart. Lactation Consultation Note  Patient Name: Ashley Holden [id number] Date: 06/14/2019 Reason for consult: Follow-up assessment;Term  LC in to visit with P2 Mom and FOB of term baby at 78 hrs old.  Baby at 5% weight loss.  Mom asked for formula because of not having milk yet. First formula feeding just given by FOB, Mom resting in bed.  Reviewed breast massage and hand expression.  Colostrum easily expressed and Mom smiled.    Encouraged STS and feeding baby often with cues.  Mom encouraged to offer breast before formula, but reassured them that breastfeeding often would bring her milk volume in sooner, and formula is not medically indicated.   Mom aware of latch assistance available if needed.   Interventions Interventions: Breast feeding basics reviewed;Skin to skin;Breast massage;Hand express   Consult Status Consult Status: Follow-up Date: 06/15/19 Follow-up type: In-patient    [name] 06/14/2019, 9:12 AM

## 2019-06-15 ENCOUNTER — Ambulatory Visit: Payer: Self-pay

## 2019-06-15 NOTE — Lactation Note (Signed)
This note was copied from a baby's chart. Lactation Consultation Note  Patient Name: Ashley Holden Today's Date: 06/15/2019   Infant is 66 hrs old & was admitted to the NICU this morning. RN said Mom had questions for me about increasing milk production. The husband interprets for Mom.   Mom is a P2 who lactated with her 1st child for 9 months (fed at the breast for 6 mo & then pumped and bottle fed for an additional 3 months). With her 1st child, Mom felt that her breasts were full on the day after birth, but had full volume on the 4th day postpartum (she said she could pump 25 oz/day).   Mom has been comfortably pumping with the DEBP using size 27 flanges. I taught hand expression to Mom & Dad commented that she was getting more out with hand expression than with the pump. I suggested Mom add hand expression after pumping.   I taught Dad how to wash pump parts. Mom says she has a hand pump at home. Parents are aware of [facility] loaner option, but Mom plans to spend the night in the NICU & use the pump at the bedside.   Matthias Hughs [facility] 06/15/2019, 1:31 PM

## 2019-06-16 ENCOUNTER — Ambulatory Visit: Payer: Self-pay

## 2019-06-16 NOTE — Lactation Note (Addendum)
This note was copied from a baby's chart. Lactation Consultation Note  Patient Name: Ashley Holden Today's Date: 06/16/2019 Reason for consult: NICU baby  While on NICU rounds, I dropped by to see Mom. Dad was present to interpret. Infant is now 73 hrs old. Mom is pumping q3hrs. This morning she was able to obtain 5 mL, which is the most she has obtained so far.  Parents remember that there is the option of a [facility] loaner, if needed. Dad says Mom may prefer to just use the hand pump she has at home.   [facility] referral faxed 12/13 @ 1348.   Matthias Hughs [facility] 06/16/2019, 11:29 AM

## 2019-06-25 ENCOUNTER — Encounter (HOSPITAL_COMMUNITY): Payer: Self-pay | Admitting: Obstetrics and Gynecology

## 2019-12-10 ENCOUNTER — Encounter: Payer: Self-pay | Admitting: Family Medicine

## 2019-12-10 ENCOUNTER — Other Ambulatory Visit: Payer: Self-pay

## 2019-12-10 ENCOUNTER — Encounter: Payer: Self-pay | Admitting: General Practice

## 2019-12-10 ENCOUNTER — Ambulatory Visit (INDEPENDENT_AMBULATORY_CARE_PROVIDER_SITE_OTHER): Payer: Self-pay | Admitting: Family Medicine

## 2019-12-10 ENCOUNTER — Ambulatory Visit: Payer: Self-pay | Admitting: *Deleted

## 2019-12-10 ENCOUNTER — Other Ambulatory Visit (HOSPITAL_COMMUNITY)
Admission: RE | Admit: 2019-12-10 | Discharge: 2019-12-10 | Disposition: A | Discharge status: Home / Self Care | Payer: Self-pay | Source: Ambulatory Visit | Attending: Family Medicine | Admitting: Family Medicine

## 2019-12-10 VITALS — BP 123/67 | HR 52 | Wt 132.4 lb

## 2019-12-10 VITALS — BP 115/78 | Temp 98.2°F | Wt 132.6 lb

## 2019-12-10 DIAGNOSIS — Z3202 Encounter for pregnancy test, result negative: Secondary | ICD-10-CM

## 2019-12-10 DIAGNOSIS — R87612 Low grade squamous intraepithelial lesion on cytologic smear of cervix (LGSIL): Secondary | ICD-10-CM | POA: Insufficient documentation

## 2019-12-10 DIAGNOSIS — Z1239 Encounter for other screening for malignant neoplasm of breast: Secondary | ICD-10-CM

## 2019-12-10 LAB — POCT PREGNANCY, URINE: Preg Test, Ur: NEGATIVE

## 2019-12-10 NOTE — Progress Notes (Signed)
    GYNECOLOGY CLINIC COLPOSCOPY PROCEDURE NOTE  34 y.o. [id number] here for colposcopy for LSIL, neg HPV pap smear on 10/29/2019. Discussed role for HPV in cervical dysplasia, need for surveillance.  Patient given informed consent, signed copy in the chart, time out was performed. Urine pregnancy test was confirmed negative prior to start of procedure. Placed in lithotomy position. Cervix viewed with speculum and colposcope after application of acetic acid.   Colposcopy adequate? Yes  acetowhite lesion(s) noted, see visit note for locations due to unusual orientation of cervical os; corresponding biopsies obtained.  ECC specimen obtained. All specimens were labeled and sent to pathology.  Patient was given post procedure instructions.  Will follow up pathology and manage accordingly; patient will be contacted with results and recommendations.  Routine preventative health maintenance measures emphasized.  Zack Seal, MD/MPH OB Fellow

## 2019-12-10 NOTE — Progress Notes (Signed)
Ms. Ashley Holden is a 34 y.o. female who presents to [facility] clinic today with a referral from the [facility] for a colposcopy to follow-up for her abnormal Pap smear on 10/29/2019.   Pap Smear: Pap not smear completed today. Last Pap smear was 10/29/2019 at the [facility] clinic and was LSIL with negative HPV. Per patient her most recent Pap smear is the only abnormal Pap smear she has had. Last Pap smear result is in Epic.   Physical exam: Breasts Breasts symmetrical. No skin abnormalities bilateral breasts. No nipple retraction bilateral breasts. No nipple discharge bilateral breasts. No lymphadenopathy. No lumps palpated bilateral breasts. No complaints of pain or tenderness on exam. Screening mammogram recommended at age 40 unless clinically indicated prior.       Pelvic/Bimanual Pap is not indicated today per BCCCP guidelines.   Smoking History: Patient has has never smoked.   Patient Navigation: Patient education provided. Access to services provided for patient through [facility] program. Spanish interpreter Fenton Foy from [facility] provided.  Breast and Cervical Cancer Risk Assessment: Patient has no family history of breast cancer, known genetic mutations, or radiation treatment to the chest before age 47. Patient has no history of cervical dysplasia, immunocompromised, or DES exposure in-utero. Breast Cancer risk assessment completed. No breast cancer risk calculated due to patient is less than 39 years old.  A: BCCCP exam without pap smear.  P: Referred patient to the [facility] for [facility] for a colposcopy to follow-up for her abnormal Pap smear. Appointment scheduled Tuesday, December 10, 2019 at 1315.  Priscille Heidelberg, RN 12/10/2019 2:39 PM

## 2019-12-10 NOTE — Assessment & Plan Note (Signed)
Seen today for colposcopy. Unusual testing history with normal pap 01/2019 and repeat pap 10/2019 with LSIL, neg HPV. Discussed with patient that she has the option of deferring colposcopy today as she was screened inappropriately early and per ASCCP guidelines would not be unreasonable to simply repeat co-testing in one year. Also discussed lesions likely to heal on their own, especially in absence of HPV. After discussion patient elected to proceed with colposcopy. Informed written consent obtained, see procedure note for details. Will follow up on results by phone.

## 2019-12-10 NOTE — Progress Notes (Signed)
GYNECOLOGY OFFICE VISIT NOTE  History:   Ashley Holden is a 34 y.o. [phone] here today for colpo.  Patient referred by [facility] Review of records shows normal pap smear during most recent pregnancy on 01/10/2019 at [facility] Unclear why but she had repeat pap smear on 10/29/2019, that result was LSIL w neg HPV   Past Medical History:  Diagnosis Date  . Ectopic pregnancy    RIGHT ON 08/13/2009- METHOTREXATE AND 08/31/10 ON LEFT - METHOTREXATE  . Medical history non-contributory   . Oligomenorrhea     Past Surgical History:  Procedure Laterality Date  . NO PAST SURGERIES      The following portions of the patient's history were reviewed and updated as appropriate: allergies, current medications, past family history, past medical history, past social history, past surgical history and problem list.   Health Maintenance:  See above  Review of Systems:  Pertinent items noted in HPI and remainder of comprehensive ROS otherwise negative.  Physical Exam:  BP 123/67   Pulse (!) 52   Wt 132 lb 6.4 oz (60.1 kg)   LMP 11/22/2019 (Exact Date)   BMI 24.22 kg/m   Physical Exam  Genitourinary:       Genitourinary Comments: Cervix multiparous and oriented at sloping angle. Biopsies taken from areas in red     CONSTITUTIONAL: Well-developed, well-nourished female in no acute distress.  HEENT:  Normocephalic, atraumatic. External right and left ear normal. No scleral icterus.  NECK: Normal range of motion, supple, no masses noted on observation SKIN: No rash noted. Not diaphoretic. No erythema. No pallor. MUSCULOSKELETAL: Normal range of motion. No edema noted. NEUROLOGIC: Alert and oriented to person, place, and time. Normal muscle tone coordination. PSYCHIATRIC: Normal mood and affect. Normal behavior. Normal judgment and thought content. RESPIRATORY: Effort normal, no problems with respiration noted ABDOMEN: No masses noted. No other overt distention noted.   PELVIC: cervical os  with multiparous appearance. Acetowhite changes in two areas, see diagram  Labs and Imaging Results for orders placed or performed in visit on 12/10/19 (from the past 168 hour(s))  Pregnancy, urine POC   Collection Time: 12/10/19  1:51 PM  Result Value Ref Range   Preg Test, Ur NEGATIVE NEGATIVE   No results found.    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 Unusual testing history with normal pap 01/2019 and repeat pap 10/2019 with LSIL, neg HPV. Discussed with patient that she has the option of deferring colposcopy today as she was screened inappropriately early and per ASCCP guidelines would not be unreasonable to simply repeat co-testing in one year. Also discussed lesions likely to heal on their own, especially in absence of HPV. After discussion patient elected to proceed with colposcopy. Informed written consent obtained, see procedure note for details. Will follow up on results by phone.       Relevant Orders   Pregnancy, urine POC (Completed)   Surgical pathology( [place]/ [facility])      Routine preventative health maintenance measures emphasized. Please refer to After Visit Summary for other counseling recommendations.   Return once biopsy results are available.    Total face-to-face time with patient: 20 minutes.  Over 50% of encounter was spent on counseling and coordination of care.   Zack Seal, MD/MPH [facility] Fellow Center for [employer], [facility]

## 2019-12-10 NOTE — Patient Instructions (Signed)
Explained breast self awareness with Ivery Quale. Patient did not need a Pap smear today due to last Pap smear was 10/29/2019. Explained the colposcopy the recommended follow-up for her abnormal Pap smear. Referred patient to the [facility] for [facility] for a colposcopy to follow-up for her abnormal Pap smear. Appointment scheduled Tuesday, December 10, 2019 at 1315. Patient aware of appointment and will be there. Let patient know a screening mammogram is recommended at age 84 unless clinically indicated prior. Ivery Quale verbalized understanding.  Primo Innis, Kathaleen Maser, RN 2:40 PM

## 2019-12-12 ENCOUNTER — Telehealth: Payer: Self-pay | Admitting: Family Medicine

## 2019-12-12 LAB — SURGICAL PATHOLOGY

## 2019-12-12 NOTE — Telephone Encounter (Signed)
Called patient to discuss results of colpo. CIN-I, repeat co-testing in 1 year. Reassured patient of results and importance of repeat testing on 1 year. All questions answered. To follow up w BCCCP.

## 2020-02-06 ENCOUNTER — Encounter: Payer: Self-pay | Admitting: *Deleted

## 2020-02-24 ENCOUNTER — Encounter: Payer: Self-pay | Admitting: *Deleted

## 2020-10-10 IMAGING — US US MFM OB COMP + 14 WK
1 series · 13 of 28 positions shown · non-contrast
Comparison: none

[Series 1: us mfm ob comp + 14 wk · 68 acquisitions, 13 frames shown]
[im 3/68]
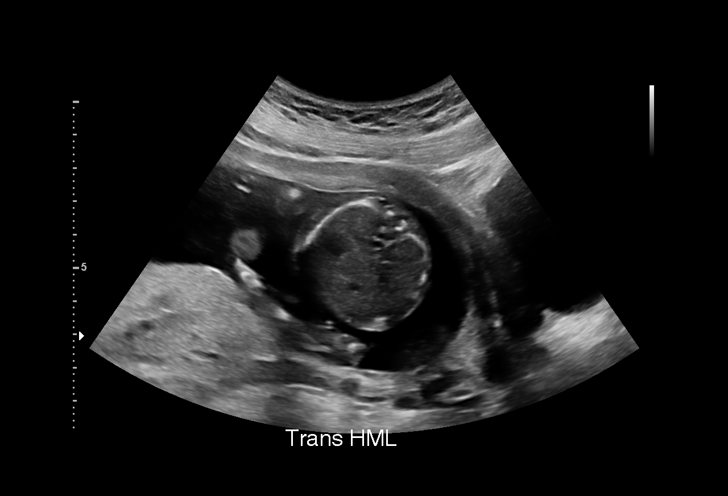
[im 8/68]
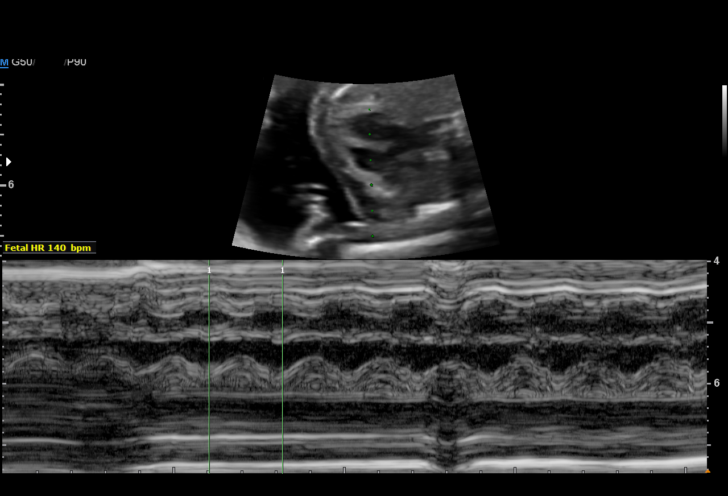
[im 13/68]
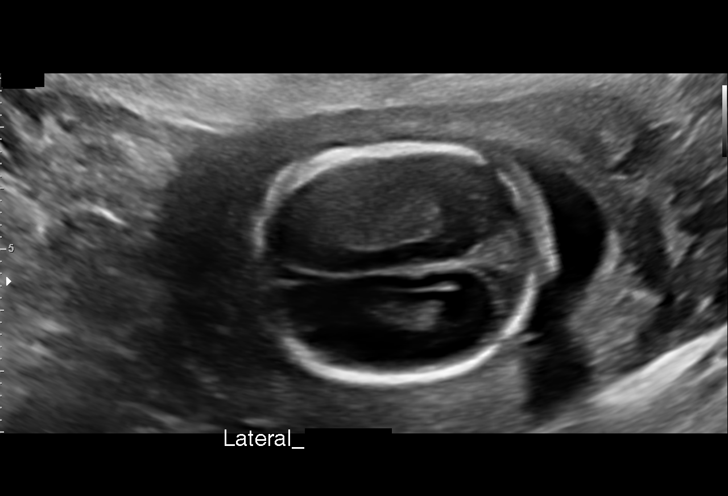
[im 18/68]
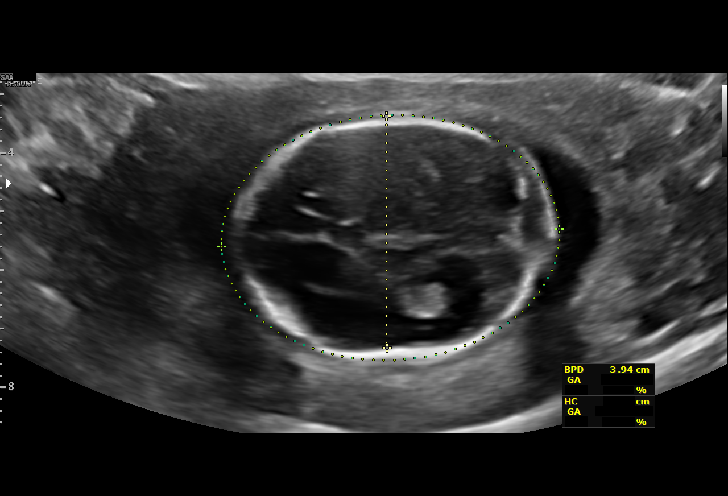
[im 23/68]
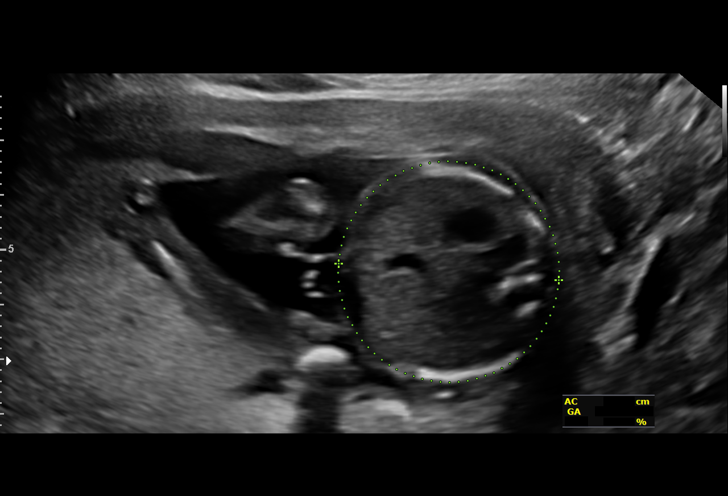
[im 28/68]
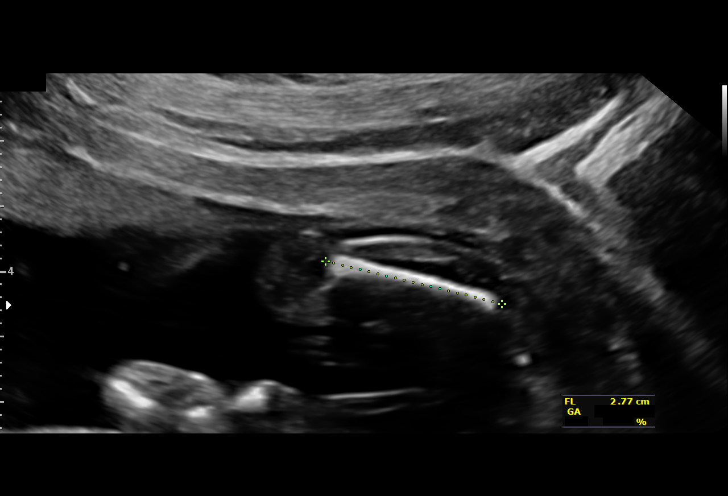
[im 35/68]
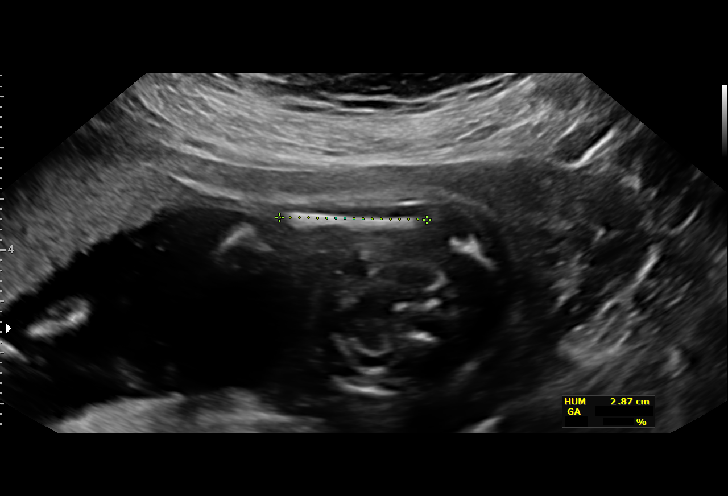
[im 40/68]
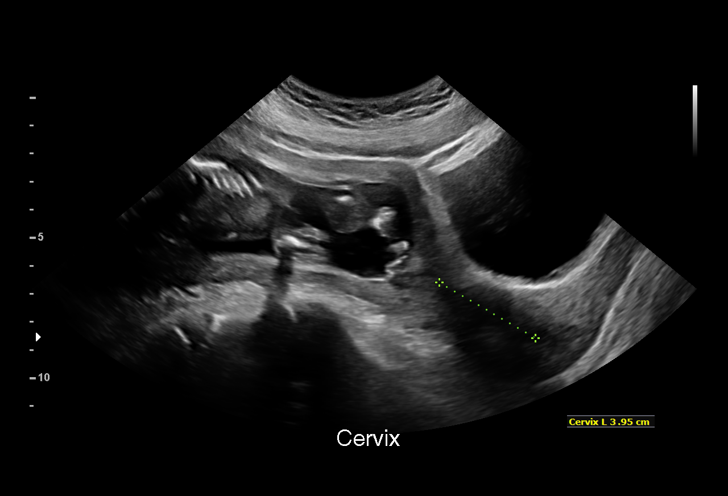
[im 45/68]
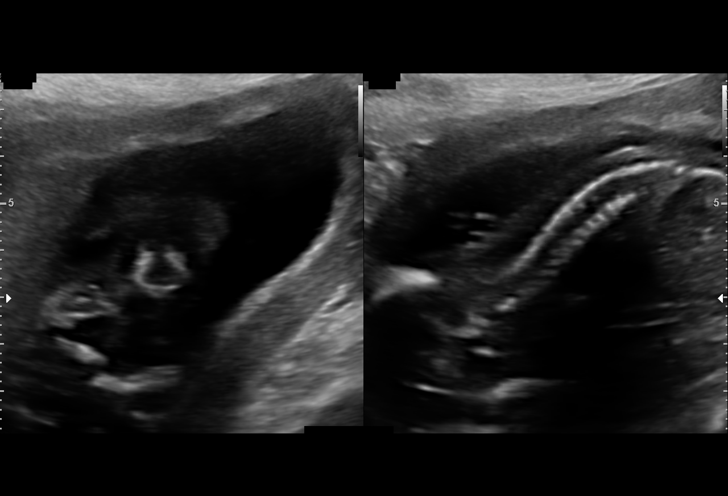
[im 50/68]
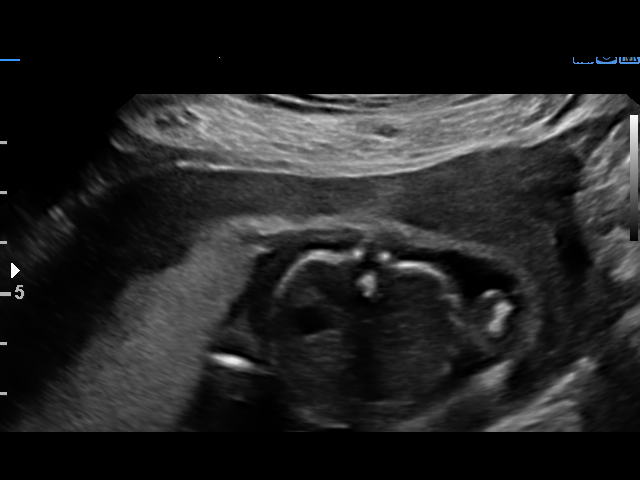
[im 55/68]
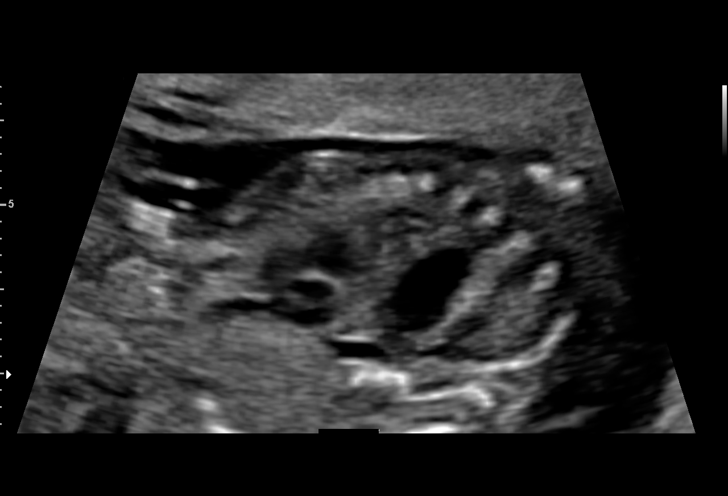
[im 60/68]
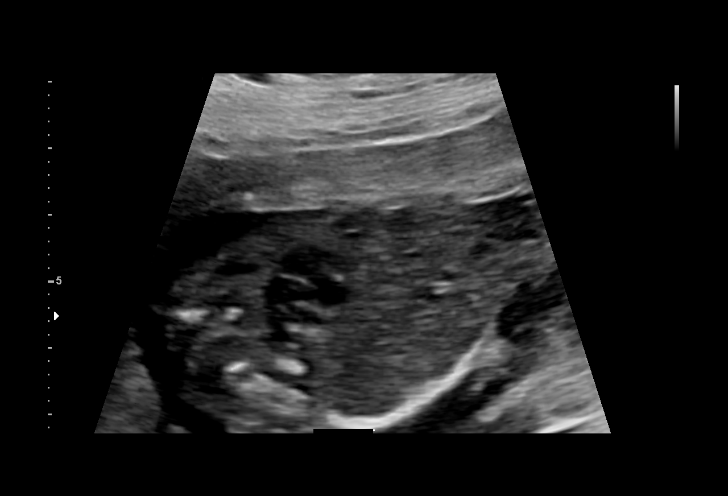
[im 65/68]
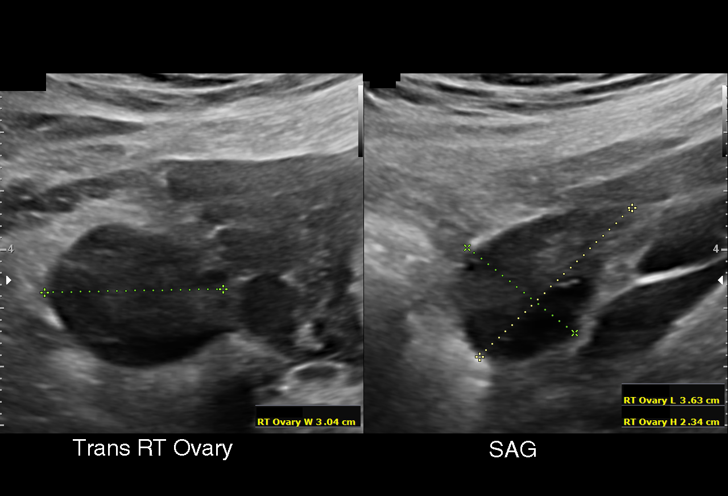

[13 of 28 positions shown; findings below may reference images not displayed]

[REDACTED]

                   [REDACTED]-
                   Faculty Physician

 ----------------------------------------------------------------------

 ----------------------------------------------------------------------
Indications

  Encounter for antenatal screening for
  malformations
  18 weeks gestation of pregnancy
 ----------------------------------------------------------------------
Vital Signs

 BMI:
Fetal Evaluation

 Num Of Fetuses:         1
 Fetal Heart Rate(bpm):  140
 Cardiac Activity:       Observed
 Presentation:           Variable
 Placenta:               Fundal
 P. Cord Insertion:      Visualized

 Amniotic Fluid
 AFI FV:      Within normal limits

                             Largest Pocket(cm)

Biometry

 BPD:        39  mm     G. Age:  17w 6d         13  %    CI:        62.95   %    70 - 86
                                                         FL/HC:      17.2   %    16.1 -
 HC:      158.5  mm     G. Age:  18w 5d         35  %    HC/AC:      1.22        1.09 -
 AC:      130.1  mm     G. Age:  18w 4d         35  %    FL/BPD:     69.7   %
 FL:       27.2  mm     G. Age:  18w 2d         24  %    FL/AC:      20.9   %    20 - 24
 HUM:        28  mm     G. Age:  19w 0d         55  %
 CER:      19.4  mm     G. Age:  18w 5d         45  %
 NFT:       4.6  mm
 LV:        5.3  mm
 CM:        3.5  mm

 Est. FW:     240  gm      0 lb 8 oz     23  %
OB History

 Gravidity:    4         Term:   1
 Ectopic:      2        Living:  1
Gestational Age

 LMP:           18w 6d        Date:  09/12/18                 EDD:   06/19/19
 U/S Today:     18w 3d                                        EDD:   06/22/19
 Best:          18w 6d     Det. By:  LMP  (09/12/18)          EDD:   06/19/19
Anatomy

 Cranium:               Appears normal         Aortic Arch:            Not well visualized
 Cavum:                 Appears normal         Ductal Arch:            Not well visualized
 Ventricles:            Appears normal         Diaphragm:              Appears normal
 Choroid Plexus:        Appears normal         Stomach:                Appears normal, left
                                                                       sided
 Cerebellum:            Appears normal         Abdomen:                Appears normal
 Posterior Fossa:       Appears normal         Abdominal Wall:         Appears nml (cord
                                                                       insert, abd wall)
 Nuchal Fold:           Appears normal         Cord Vessels:           Appears normal (3
                                                                       vessel cord)
 Face:                  Orbits appear          Kidneys:                Appear normal
                        normal; profile to be
                        see
 Lips:                  Appears normal         Bladder:                Appears normal
 Thoracic:              Appears normal         Spine:                  Limited views
                                                                       appear normal
 Heart:                 Not well visualized    Upper Extremities:      Appears normal
 RVOT:                  Appears normal         Lower Extremities:      Appears normal
 LVOT:                  Appears normal

 Other:  Female gender Technically difficult due to fetal position.
Cervix Uterus Adnexa

 Cervix
 Length:           3.48  cm.
 Normal appearance by transabdominal scan.

 Uterus
 No abnormality visualized.

 Left Ovary
 Within normal limits.

 Right Ovary
 Within normal limits.
 Cul De Sac
 No free fluid seen.

 Adnexa
 No abnormality visualized. No adnexal mass
 visualized. No free fluid.
Impression

 We performed fetal anatomy scan. No makers of
 aneuploidies or fetal structural defects are seen. Fetal
 biometry is consistent with her previously-established dates.
 Amniotic fluid is normal and good fetal activity is seen.
 Patient understands the limitations of ultrasound in detecting
 fetal anomalies.
 Insufficient prenatal care. Patient has not had screening for
 fetal aneuploidies.
Recommendations

 An appointment was made for her to return in 4 weeks for
 completion of fetal anatomy (4-ch, arches, profile, spine).
 -Patient can be offered quad screening at your office (before
 21 weeks).
                 [place], Ingunn Harpa
